# Patient Record
Sex: Male | Born: 1965 | Hispanic: Yes | Smoking: Never smoker
Health system: Southern US, Community
[De-identification: ages and names within clinical notes are randomized; demographics above are authoritative.]

## PROBLEM LIST (undated history)

## (undated) DIAGNOSIS — F419 Anxiety disorder, unspecified: Secondary | ICD-10-CM

## (undated) DIAGNOSIS — K759 Inflammatory liver disease, unspecified: Secondary | ICD-10-CM

## (undated) DIAGNOSIS — Z5189 Encounter for other specified aftercare: Secondary | ICD-10-CM

## (undated) DIAGNOSIS — D649 Anemia, unspecified: Secondary | ICD-10-CM

## (undated) HISTORY — DX: Encounter for other specified aftercare: Z51.89

## (undated) HISTORY — DX: Anemia, unspecified: D64.9

---

## 1999-03-05 ENCOUNTER — Emergency Department (HOSPITAL_COMMUNITY): Admission: EM | Admit: 1999-03-05 | Discharge: 1999-03-05 | Payer: Self-pay | Admitting: Emergency Medicine

## 1999-03-05 ENCOUNTER — Encounter: Payer: Self-pay | Admitting: Emergency Medicine

## 1999-03-08 ENCOUNTER — Emergency Department (HOSPITAL_COMMUNITY): Admission: EM | Admit: 1999-03-08 | Discharge: 1999-03-08 | Payer: Self-pay | Admitting: Emergency Medicine

## 2009-03-18 ENCOUNTER — Emergency Department (HOSPITAL_COMMUNITY): Admission: EM | Admit: 2009-03-18 | Discharge: 2009-03-18 | Payer: Self-pay | Admitting: Emergency Medicine

## 2010-04-09 ENCOUNTER — Encounter (INDEPENDENT_AMBULATORY_CARE_PROVIDER_SITE_OTHER): Payer: Self-pay | Admitting: *Deleted

## 2010-04-09 ENCOUNTER — Ambulatory Visit: Payer: Self-pay | Admitting: Internal Medicine

## 2010-04-09 LAB — CONVERTED CEMR LAB
ALT: 29 units/L (ref 0–53)
AST: 24 units/L (ref 0–37)
Albumin: 4.6 g/dL (ref 3.5–5.2)
Alkaline Phosphatase: 110 units/L (ref 39–117)
BUN: 14 mg/dL (ref 6–23)
Basophils Absolute: 0 10*3/uL (ref 0.0–0.1)
Basophils Relative: 1 % (ref 0–1)
CO2: 26 meq/L (ref 19–32)
Calcium: 9.4 mg/dL (ref 8.4–10.5)
Chloride: 105 meq/L (ref 96–112)
Cholesterol: 207 mg/dL — ABNORMAL HIGH (ref 0–200)
Creatinine, Ser: 0.85 mg/dL (ref 0.40–1.50)
Eosinophils Absolute: 0.1 10*3/uL (ref 0.0–0.7)
Eosinophils Relative: 2 % (ref 0–5)
Glucose, Bld: 104 mg/dL — ABNORMAL HIGH (ref 70–99)
HCT: 49.5 % (ref 39.0–52.0)
HDL: 49 mg/dL (ref >39)
Hemoglobin: 16.4 g/dL (ref 13.0–17.0)
LDL Cholesterol: 120 mg/dL — ABNORMAL HIGH (ref 0–99)
Lymphocytes Relative: 31 % (ref 12–46)
Lymphs Abs: 2.1 10*3/uL (ref 0.7–4.0)
MCHC: 33.1 g/dL (ref 30.0–36.0)
MCV: 91.3 fL (ref 78.0–100.0)
Monocytes Absolute: 0.7 10*3/uL (ref 0.1–1.0)
Monocytes Relative: 10 % (ref 3–12)
Neutro Abs: 3.9 10*3/uL (ref 1.7–7.7)
Neutrophils Relative %: 57 % (ref 43–77)
Platelets: 226 10*3/uL (ref 150–400)
Potassium: 4.3 meq/L (ref 3.5–5.3)
RBC: 5.42 M/uL (ref 4.22–5.81)
RDW: 13.2 % (ref 11.5–15.5)
Sodium: 141 meq/L (ref 135–145)
TSH: 2.225 microintl units/mL (ref 0.350–4.500)
Total Bilirubin: 0.7 mg/dL (ref 0.3–1.2)
Total CHOL/HDL Ratio: 4.2
Total Protein: 7.3 g/dL (ref 6.0–8.3)
Triglycerides: 188 mg/dL — ABNORMAL HIGH (ref <150)
VLDL: 38 mg/dL (ref 0–40)
WBC: 6.8 10*3/uL (ref 4.0–10.5)

## 2011-07-02 ENCOUNTER — Ambulatory Visit: Payer: Self-pay

## 2011-07-02 DIAGNOSIS — R002 Palpitations: Secondary | ICD-10-CM

## 2013-02-24 ENCOUNTER — Encounter (HOSPITAL_COMMUNITY): Payer: Self-pay

## 2013-02-24 ENCOUNTER — Emergency Department (HOSPITAL_COMMUNITY)
Admission: EM | Admit: 2013-02-24 | Discharge: 2013-02-24 | Disposition: A | Discharge status: Home / Self Care | Payer: Self-pay | Attending: Emergency Medicine | Admitting: Emergency Medicine

## 2013-02-24 DIAGNOSIS — F1010 Alcohol abuse, uncomplicated: Secondary | ICD-10-CM | POA: Insufficient documentation

## 2013-02-24 DIAGNOSIS — F419 Anxiety disorder, unspecified: Secondary | ICD-10-CM | POA: Diagnosis present

## 2013-02-24 DIAGNOSIS — F411 Generalized anxiety disorder: Secondary | ICD-10-CM | POA: Insufficient documentation

## 2013-02-24 DIAGNOSIS — R000 Tachycardia, unspecified: Secondary | ICD-10-CM | POA: Insufficient documentation

## 2013-02-24 DIAGNOSIS — Z79899 Other long term (current) drug therapy: Secondary | ICD-10-CM | POA: Insufficient documentation

## 2013-02-24 HISTORY — DX: Anxiety disorder, unspecified: F41.9

## 2013-02-24 MED ORDER — LORAZEPAM 1 MG PO TABS
1.0000 mg | ORAL_TABLET | Freq: Once | ORAL | Status: AC
  Administered 2013-02-24: 1 mg via ORAL
  Filled 2013-02-24: qty 1

## 2013-02-24 MED ORDER — HYDROXYZINE HCL 25 MG PO TABS: 25.0000 mg | ORAL_TABLET | Freq: Three times a day (TID) | ORAL | Status: DC | PRN

## 2013-02-24 MED ORDER — HYDROXYZINE HCL 25 MG PO TABS: ORAL_TABLET | ORAL | Status: DC

## 2013-02-24 NOTE — ED Provider Notes (Signed)
CSN: [id number]     Arrival date & time 02/24/13  1742 History     First MD Initiated Contact with Patient 02/24/13 1841     Chief Complaint  Patient presents with  . Tachycardia  . Panic Attack   (Consider location/radiation/quality/duration/timing/severity/associated sxs/prior Treatment) Patient is a 47 y.o. male presenting with mental health disorder. The history is provided by the patient.  Mental Health Problem Presenting symptoms comment:  Anxiety  Degree of incapacity (severity):  Mild Onset quality:  Sudden Duration:  3 days Timing:  Intermittent Progression:  Unchanged Chronicity:  Recurrent Context comment:  Concerned about recent sexual encounter, worried about work Treatment compliance:  Untreated Time since last psychoactive medication taken:  2 months Relieved by: deep breathing, drinking beer. Worsened by:  Nothing tried Ineffective treatments: citalopram. Associated symptoms: no abdominal pain, no chest pain and no headaches     Past Medical History  Diagnosis Date  . Anxiety    History reviewed. No pertinent past surgical history. Family History  Problem Relation Age of Onset  . Hypertension Father   . Diabetes Father    History  Substance Use Topics  . Smoking status: Never Smoker   . Smokeless tobacco: Never Used  . Alcohol Use: Yes     Comment: 4-5 beers daily    Review of Systems  Constitutional: Negative for fever.  HENT: Negative for rhinorrhea, drooling and neck pain.   Eyes: Negative for pain.  Respiratory: Negative for cough and shortness of breath.   Cardiovascular: Negative for chest pain and leg swelling.  Gastrointestinal: Negative for nausea, vomiting, abdominal pain and diarrhea.  Genitourinary: Negative for dysuria and hematuria.  Musculoskeletal: Negative for gait problem.  Skin: Negative for color change.  Neurological: Negative for numbness and headaches.  Hematological: Negative for adenopathy.  Psychiatric/Behavioral:  Negative for behavioral problems.  All other systems reviewed and are negative.    Allergies  Review of patient's allergies indicates no known allergies.  Home Medications   Current Outpatient Rx  Name  Route  Sig  Dispense  Refill  . ranitidine (ZANTAC) 150 MG tablet   Oral   Take 150 mg by mouth 2 (two) times daily.          BP 134/88  Pulse 124  Temp(Src) 98 F (36.7 C) (Oral)  Resp 20  Ht 5\' 5"  (1.651 m)  Wt 180 lb (81.647 kg)  BMI 29.95 kg/m2  SpO2 99% Physical Exam  Nursing note and vitals reviewed. Constitutional: He is oriented to person, place, and time. He appears well-developed and well-nourished.  HENT:  Head: Normocephalic and atraumatic.  Right Ear: External ear normal.  Left Ear: External ear normal.  Nose: Nose normal.  Mouth/Throat: Oropharynx is clear and moist. No oropharyngeal exudate.  Eyes: Conjunctivae and EOM are normal. Pupils are equal, round, and reactive to light.  Neck: Normal range of motion. Neck supple.  Cardiovascular: Regular rhythm, normal heart sounds and intact distal pulses.  Exam reveals no gallop and no friction rub.   No murmur heard. Pulmonary/Chest: Effort normal and breath sounds normal. No respiratory distress. He has no wheezes.  Abdominal: Soft. Bowel sounds are normal. He exhibits no distension. There is no tenderness. There is no rebound and no guarding.  Musculoskeletal: Normal range of motion. He exhibits no edema and no tenderness.  Neurological: He is alert and oriented to person, place, and time.  Skin: Skin is warm and dry.  Psychiatric: He has a normal mood and affect. His  behavior is normal.    ED Course   Procedures (including critical care time)  Labs Reviewed  GC/CHLAMYDIA PROBE AMP   No results found. 1. Anxiety      Date: 02/24/2013  Rate: 127  Rhythm: sinus tachycardia  QRS Axis: normal  Intervals: normal  ST/T Wave abnormalities: normal  Conduction Disutrbances:none  Narrative  Interpretation: No ST or T wave changes consistent with ischemia.   Old EKG Reviewed: none available   MDM  7:17 PM 47 y.o. male w hx of anxiety/panic attacks who pw increasing freq of panic attacks in last few days. Pt tachycardic here, notes inc freq of episodes. Denies cp or sob. Appears well on exam. Will give ativan. He is also concerned about a recent sexual encounter, but denies GU sx. Will perform gc/chl amp at his request.   8:52 PM: Pt feeling better after ativan, HR has decreased.  I have discussed the diagnosis/risks/treatment options with the patient and believe the pt to be eligible for discharge home to follow-up and establish with a pcp for his anxiety attacks. We also discussed returning to the ED immediately if new or worsening sx occur. We discussed the sx which are most concerning (e.g., worsening anxiety, cp, sob) that necessitate immediate return. Any new prescriptions provided to the patient are listed below.  New Prescriptions   No medications on file      Junius Argyle, MD 02/25/13 [phone]

## 2013-02-24 NOTE — ED Notes (Addendum)
Patient reports that he has a history of anxiety and has had anxiety attacks. Patient states he had SOB and fast heart rate at 0330 today. Patient states he has had increased stress. Patient states that he has not taken citalopram in 2 months.

## 2013-02-24 NOTE — ED Notes (Signed)
Pt denies chest pain or nausea. Pt c/o intermittent shortness of breath. Pt states "it feels like my heart is beating real hard.". Pt admits to hx of anxiety. Pt a/o x 4. No acute distress. Pt talkative.

## 2013-02-25 LAB — GC/CHLAMYDIA PROBE AMP
CT Probe RNA: NEGATIVE
GC Probe RNA: NEGATIVE

## 2017-03-01 ENCOUNTER — Encounter (HOSPITAL_COMMUNITY): Payer: Self-pay | Admitting: Emergency Medicine

## 2017-03-01 ENCOUNTER — Emergency Department (HOSPITAL_COMMUNITY)
Admission: EM | Admit: 2017-03-01 | Discharge: 2017-03-01 | Disposition: A | Discharge status: Home / Self Care | Payer: Self-pay | Attending: Emergency Medicine | Admitting: Emergency Medicine

## 2017-03-01 ENCOUNTER — Emergency Department (HOSPITAL_COMMUNITY): Payer: Self-pay

## 2017-03-01 DIAGNOSIS — M791 Myalgia: Secondary | ICD-10-CM | POA: Insufficient documentation

## 2017-03-01 DIAGNOSIS — Z79899 Other long term (current) drug therapy: Secondary | ICD-10-CM | POA: Insufficient documentation

## 2017-03-01 DIAGNOSIS — R69 Illness, unspecified: Secondary | ICD-10-CM | POA: Insufficient documentation

## 2017-03-01 DIAGNOSIS — J111 Influenza due to unidentified influenza virus with other respiratory manifestations: Secondary | ICD-10-CM

## 2017-03-01 DIAGNOSIS — R51 Headache: Secondary | ICD-10-CM | POA: Insufficient documentation

## 2017-03-01 DIAGNOSIS — R509 Fever, unspecified: Secondary | ICD-10-CM | POA: Insufficient documentation

## 2017-03-01 HISTORY — DX: Inflammatory liver disease, unspecified: K75.9

## 2017-03-01 LAB — COMPREHENSIVE METABOLIC PANEL
ALT: 30 U/L (ref 17–63)
AST: 22 U/L (ref 15–41)
Albumin: 3.7 g/dL (ref 3.5–5.0)
Alkaline Phosphatase: 79 U/L (ref 38–126)
Anion gap: 11 (ref 5–15)
BILIRUBIN TOTAL: 0.9 mg/dL (ref 0.3–1.2)
BUN: 10 mg/dL (ref 6–20)
CHLORIDE: 99 mmol/L — AB (ref 101–111)
CO2: 24 mmol/L (ref 22–32)
CREATININE: 1.05 mg/dL (ref 0.61–1.24)
Calcium: 9 mg/dL (ref 8.9–10.3)
GFR calc Af Amer: >60 mL/min (ref >60)
Glucose, Bld: 102 mg/dL — ABNORMAL HIGH (ref 65–99)
POTASSIUM: 3.9 mmol/L (ref 3.5–5.1)
SODIUM: 134 mmol/L — AB (ref 135–145)
TOTAL PROTEIN: 8.1 g/dL (ref 6.5–8.1)

## 2017-03-01 LAB — URINALYSIS, ROUTINE W REFLEX MICROSCOPIC
Bacteria, UA: NONE SEEN
Bilirubin Urine: NEGATIVE
Glucose, UA: NEGATIVE mg/dL
Hgb urine dipstick: NEGATIVE
KETONES UR: 5 mg/dL — AB
Leukocytes, UA: NEGATIVE
Nitrite: NEGATIVE
PH: 5 (ref 5.0–8.0)
Protein, ur: 30 mg/dL — AB
SPECIFIC GRAVITY, URINE: 1.031 — AB (ref 1.005–1.030)
SQUAMOUS EPITHELIAL / LPF: NONE SEEN

## 2017-03-01 LAB — I-STAT CG4 LACTIC ACID, ED: LACTIC ACID, VENOUS: 1.29 mmol/L (ref 0.5–1.9)

## 2017-03-01 LAB — CBC WITH DIFFERENTIAL/PLATELET
BASOS ABS: 0 10*3/uL (ref 0.0–0.1)
Basophils Relative: 0 %
EOS ABS: 0 10*3/uL (ref 0.0–0.7)
EOS PCT: 0 %
HCT: 41.3 % (ref 39.0–52.0)
Hemoglobin: 13.4 g/dL (ref 13.0–17.0)
Lymphocytes Relative: 7 %
Lymphs Abs: 0.6 10*3/uL — ABNORMAL LOW (ref 0.7–4.0)
MCH: 25.9 pg — ABNORMAL LOW (ref 26.0–34.0)
MCHC: 32.4 g/dL (ref 30.0–36.0)
MCV: 79.9 fL (ref 78.0–100.0)
Monocytes Absolute: 1.2 10*3/uL — ABNORMAL HIGH (ref 0.1–1.0)
Monocytes Relative: 14 %
Neutro Abs: 6.7 10*3/uL (ref 1.7–7.7)
Neutrophils Relative %: 79 %
PLATELETS: 245 10*3/uL (ref 150–400)
RBC: 5.17 MIL/uL (ref 4.22–5.81)
RDW: 13.5 % (ref 11.5–15.5)
WBC: 8.6 10*3/uL (ref 4.0–10.5)

## 2017-03-01 MED ORDER — ACETAMINOPHEN 325 MG PO TABS
ORAL_TABLET | ORAL | Status: DC
Start: 2017-03-01 — End: 2017-03-02
  Filled 2017-03-01: qty 2

## 2017-03-01 MED ORDER — ACETAMINOPHEN 325 MG PO TABS
325.0000 mg | ORAL_TABLET | Freq: Once | ORAL | Status: AC
  Administered 2017-03-01: 650 mg via ORAL

## 2017-03-01 MED ORDER — SODIUM CHLORIDE 0.9 % IV BOLUS (SEPSIS)
1000.0000 mL | Freq: Once | INTRAVENOUS | Status: AC
  Administered 2017-03-01: 1000 mL via INTRAVENOUS

## 2017-03-01 MED ORDER — LORAZEPAM 1 MG PO TABS
1.0000 mg | ORAL_TABLET | Freq: Once | ORAL | Status: AC
  Administered 2017-03-01: 1 mg via ORAL
  Filled 2017-03-01: qty 1

## 2017-03-01 MED ORDER — IBUPROFEN 800 MG PO TABS
800.0000 mg | ORAL_TABLET | Freq: Once | ORAL | Status: AC
  Administered 2017-03-01: 800 mg via ORAL
  Filled 2017-03-01: qty 1

## 2017-03-01 NOTE — ED Notes (Addendum)
Pt given 650 mg tylenol for fever 102.2

## 2017-03-01 NOTE — ED Provider Notes (Addendum)
[facility] DEPT Provider Note   CSN: [id number] Arrival date & time: 03/01/17  1358     History   Chief Complaint Chief Complaint  Patient presents with  . Cough  . Fever  . Generalized Body Aches    HPI Troy Stevenson is a 51 y.o. male.  HPI  51 year old man history of hepatitis presents today stating that he has had body aches and chills that began on Thursday. Thursday night he resolved his symptoms with Tylenol. He states they resumed on Friday. That again resolved with Tylenol. He describes his symptoms as being diffuse muscle aches, chills, sweating, and now with headache. He was seen at his primary care office today. He states that when he opens his mouth there is a comfort in the anterior neck. He denies nausea, vomiting, or diarrhea. Here he presented to triage with a fever to 102. He is given Tylenol and states this time it is not made him feel much better. He works as a Development worker, community. He has had no known sick contacts. He states he has not been out of the country [place]. Patient was seen by his primary care provider today. They referred him to the emergency department due to his symptoms. They were concerned regarding possible meningitis.  Past Medical History:  Diagnosis Date  . Anxiety   . Hepatitis     Patient Active Problem List   Diagnosis Date Noted  . Anxiety 02/24/2013    History reviewed. No pertinent surgical history.     Home Medications    Prior to Admission medications   Medication Sig Start Date End Date Taking? Authorizing Provider  hydrOXYzine (ATARAX/VISTARIL) 25 MG tablet Take 1 by mouth every 8 hours as needed for anxiety. 02/24/13   Pamella Pert, MD  ranitidine (ZANTAC) 150 MG tablet Take 150 mg by mouth 2 (two) times daily.    [provider]    Family History Family History  Problem Relation Age of Onset  . Hypertension Father   . Diabetes Father     Social History Social History   Substance Use Topics  . Smoking status: Never Smoker  . Smokeless tobacco: Never Used  . Alcohol use Yes     Comment: 4-5 beers daily     Allergies   Patient has no known allergies.   Review of Systems Review of Systems   Physical Exam Updated Vital Signs BP 125/84 (BP Location: Left Arm)   Pulse (!) 118   Temp (!) 102.2 F (39 C) (Oral)   Resp (!) 22   SpO2 97%   Physical Exam  Constitutional: He is oriented to person, place, and time. He appears well-developed and well-nourished. No distress.  HENT:  Head: Normocephalic and atraumatic.  Right Ear: External ear normal.  Left Ear: External ear normal.  Mouth/Throat: Oropharynx is clear and moist.  Eyes: Pupils are equal, round, and reactive to light. Conjunctivae and EOM are normal.  Neck: Normal range of motion. Neck supple.  Patient freely ranges his neck through a full active range of motion.  Cardiovascular: Tachycardia present.   Pulmonary/Chest: Effort normal and breath sounds normal.  Crackles right base  Abdominal: Soft. Bowel sounds are normal.  Musculoskeletal: Normal range of motion.  Neurological: He is alert and oriented to person, place, and time. He displays normal reflexes. No cranial nerve deficit or sensory deficit. He exhibits normal muscle tone. Coordination normal.  Skin: Skin is warm and dry. Capillary refill takes  less than 2 seconds.  Psychiatric: He has a normal mood and affect.  Nursing note and vitals reviewed.    ED Treatments / Results  Labs (all labs ordered are listed, but only abnormal results are displayed) Labs Reviewed  CULTURE, BLOOD (ROUTINE X 2)  CULTURE, BLOOD (ROUTINE X 2)  CBC WITH DIFFERENTIAL/PLATELET  COMPREHENSIVE METABOLIC PANEL  URINALYSIS, ROUTINE W REFLEX MICROSCOPIC  I-STAT CG4 LACTIC ACID, ED    EKG  EKG Interpretation None        Radiology Dg Chest 2 View  Result Date: 03/01/2017 CLINICAL DATA:  Patient reports since thur he has felt weak,  dizziness, body aches, headache, fever. Reports cough that started today. EXAM: CHEST  2 VIEW COMPARISON:  None. FINDINGS: Normal mediastinum and cardiac silhouette. Normal pulmonary vasculature. No evidence of effusion, infiltrate, or pneumothorax. No acute bony abnormality. IMPRESSION: No acute cardiopulmonary process. Electronically Signed   By: Suzy Bouchard M.D.   On: 03/01/2017 16:40    Procedures Procedures (including critical care time)  Medications Ordered in ED Medications  acetaminophen (TYLENOL) 325 MG tablet (not administered)  sodium chloride 0.9 % bolus 1,000 mL (not administered)  acetaminophen (TYLENOL) tablet 325 mg (650 mg Oral Given 03/01/17 1426)     Initial Impression / Assessment and Plan / ED Course  I have reviewed the triage vital signs and the nursing notes.  Pertinent labs & imaging results that were available during my care of the patient were reviewed by me and considered in my medical decision making (see chart for details).     Evaluation for infection included blood cultures 2, urinalysis, chest x-Laneta Guerin, and labs. He has received IV normal saline. His heart rate has decreased. He has a normal lactic acid level here. He otherwise appears well. Reports no history of tick bites or rashes.I reviewed the primary care physician's assessment and plan. Given his initial evaluation there, his presentation was concerning for meningitis. However, here he has no meningeal symptoms and has been observed for several hours with normal labs and vital signs. He was tachycardic initially, but this has resolved with his fever. I discussed the meningitis concerns with patient. We discussed LP. I discussed why did not think he needs an LP at this time and he is in agreement. We also discussed that if he is worse especially with headache worsening he should return to the ED. Given his well appearance and response to antipyretics and fluids, I do not see any indication for antibiotics  at this time. However, I have discussed return precautions need for close follow-up with the patient and he voices understanding.  Final Clinical Impressions(s) / ED Diagnoses   Final diagnoses:  Influenza-like illness    New Prescriptions New Prescriptions   No medications on file     Pattricia Boss, MD 03/01/17 1924    Pattricia Boss, MD 03/01/17 2028

## 2017-03-01 NOTE — ED Triage Notes (Signed)
Headache body aches , fever and cough x 4 days

## 2017-03-01 NOTE — ED Notes (Signed)
Pt to finish IV boluses prior to discharge

## 2017-03-06 LAB — CULTURE, BLOOD (ROUTINE X 2)
Culture: NO GROWTH
Culture: NO GROWTH
SPECIAL REQUESTS: ADEQUATE
Special Requests: ADEQUATE

## 2018-08-05 ENCOUNTER — Encounter: Payer: Self-pay | Admitting: Gastroenterology

## 2018-08-20 ENCOUNTER — Encounter (HOSPITAL_COMMUNITY): Payer: Self-pay

## 2018-08-20 ENCOUNTER — Other Ambulatory Visit: Payer: Self-pay

## 2018-08-20 ENCOUNTER — Emergency Department (HOSPITAL_COMMUNITY)
Admission: EM | Admit: 2018-08-20 | Discharge: 2018-08-20 | Disposition: A | Discharge status: Home / Self Care | Payer: Self-pay | Attending: Emergency Medicine | Admitting: Emergency Medicine

## 2018-08-20 DIAGNOSIS — Z79899 Other long term (current) drug therapy: Secondary | ICD-10-CM | POA: Insufficient documentation

## 2018-08-20 DIAGNOSIS — D649 Anemia, unspecified: Secondary | ICD-10-CM | POA: Insufficient documentation

## 2018-08-20 DIAGNOSIS — K922 Gastrointestinal hemorrhage, unspecified: Secondary | ICD-10-CM | POA: Insufficient documentation

## 2018-08-20 LAB — CBC
HCT: 32.6 % — ABNORMAL LOW (ref 39.0–52.0)
HEMOGLOBIN: 9.7 g/dL — AB (ref 13.0–17.0)
MCH: 22.5 pg — ABNORMAL LOW (ref 26.0–34.0)
MCHC: 29.8 g/dL — AB (ref 30.0–36.0)
MCV: 75.5 fL — ABNORMAL LOW (ref 80.0–100.0)
PLATELETS: 337 10*3/uL (ref 150–400)
RBC: 4.32 MIL/uL (ref 4.22–5.81)
RDW: 13.6 % (ref 11.5–15.5)
WBC: 4.9 10*3/uL (ref 4.0–10.5)
nRBC: 0 % (ref 0.0–0.2)

## 2018-08-20 LAB — COMPREHENSIVE METABOLIC PANEL
ALT: 22 U/L (ref 0–44)
AST: 23 U/L (ref 15–41)
Albumin: 4 g/dL (ref 3.5–5.0)
Alkaline Phosphatase: 94 U/L (ref 38–126)
Anion gap: 8 (ref 5–15)
BILIRUBIN TOTAL: 0.5 mg/dL (ref 0.3–1.2)
BUN: 8 mg/dL (ref 6–20)
CO2: 23 mmol/L (ref 22–32)
CREATININE: 0.81 mg/dL (ref 0.61–1.24)
Calcium: 9.2 mg/dL (ref 8.9–10.3)
Chloride: 107 mmol/L (ref 98–111)
GFR calc Af Amer: >60 mL/min (ref >60)
Glucose, Bld: 103 mg/dL — ABNORMAL HIGH (ref 70–99)
POTASSIUM: 3.8 mmol/L (ref 3.5–5.1)
Sodium: 138 mmol/L (ref 135–145)
TOTAL PROTEIN: 7.4 g/dL (ref 6.5–8.1)

## 2018-08-20 LAB — ABO/RH: ABO/RH(D): O POS

## 2018-08-20 LAB — LIPASE, BLOOD: Lipase: 37 U/L (ref 11–51)

## 2018-08-20 LAB — TYPE AND SCREEN
ABO/RH(D): O POS
ANTIBODY SCREEN: NEGATIVE

## 2018-08-20 NOTE — ED Provider Notes (Signed)
[facility] EMERGENCY DEPARTMENT Provider Note   CSN: [id number] Arrival date & time: 08/20/18  1455     History   Chief Complaint Chief Complaint  Patient presents with  . Rectal Bleeding  . Abdominal Pain  . Near Syncope    HPI Troy Stevenson is a 53 y.o. male.  He has had on-and-off rectal bleeding for about 5 years.  Sounds like since the beginning of January it is been more consistent every bowel movement twice a day.  There is red blood in the bowl.  Sometimes her some clots.  Not associated with any abdominal pain.  He has been to the clinic and his last hemoglobin on the 30th was 11.  He is set up to get a colonoscopy in 4 days.  Today he felt lightheaded and dizzy, which is what the clinic told him to watch out for so he presented to the emergency department.  He said he feels thirsty but he is feeling better here.  No fever no abdominal pain no shortness of breath no syncope.  The history is provided by the patient.  Rectal Bleeding  Quality:  Bright red Amount:  Moderate Duration:  2 months Timing:  Intermittent Chronicity:  New Context: defecation   Context: not foreign body and not rectal pain   Similar prior episodes: yes   Relieved by:  None tried Worsened by:  Defecation Ineffective treatments:  Time Associated symptoms: dizziness and light-headedness   Associated symptoms: no abdominal pain, no epistaxis, no fever, no loss of consciousness, no recent illness and no vomiting   Risk factors: no anticoagulant use and no hx of colorectal cancer     Past Medical History:  Diagnosis Date  . Anxiety   . Hepatitis     Patient Active Problem List   Diagnosis Date Noted  . Anxiety 02/24/2013    History reviewed. No pertinent surgical history.      Home Medications    Prior to Admission medications   Medication Sig Start Date End Date Taking? Authorizing Provider  hydrOXYzine (ATARAX/VISTARIL) 25 MG tablet Take 1 by mouth every 8  hours as needed for anxiety. 02/24/13   Pamella Pert, MD  ranitidine (ZANTAC) 150 MG tablet Take 150 mg by mouth 2 (two) times daily.    [provider]    Family History Family History  Problem Relation Age of Onset  . Hypertension Father   . Diabetes Father     Social History Social History   Tobacco Use  . Smoking status: Never Smoker  . Smokeless tobacco: Never Used  Substance Use Topics  . Alcohol use: Yes    Comment: 4-5 beers daily  . Drug use: No     Allergies   Patient has no known allergies.   Review of Systems Review of Systems  Constitutional: Positive for fatigue. Negative for chills and fever.  HENT: Negative for ear pain, nosebleeds and sore throat.   Eyes: Negative for pain and visual disturbance.  Respiratory: Negative for cough and shortness of breath.   Cardiovascular: Negative for chest pain and palpitations.  Gastrointestinal: Positive for anal bleeding and blood in stool. Negative for abdominal pain, constipation and vomiting.  Genitourinary: Negative for dysuria and hematuria.  Musculoskeletal: Negative for arthralgias and back pain.  Skin: Negative for color change and rash.  Neurological: Positive for dizziness and light-headedness. Negative for seizures, loss of consciousness and syncope.  All other systems reviewed and are negative.    Physical  Exam Updated Vital Signs BP (!) 144/74 (BP Location: Right Arm)   Pulse 91   Temp (!) 97.5 F (36.4 C) (Oral)   Resp 18   SpO2 99%   Physical Exam Vitals signs and nursing note reviewed.  Constitutional:      Appearance: He is well-developed.  HENT:     Head: Normocephalic and atraumatic.  Eyes:     Conjunctiva/sclera: Conjunctivae normal.  Neck:     Musculoskeletal: Neck supple.  Cardiovascular:     Rate and Rhythm: Normal rate and regular rhythm.     Heart sounds: No murmur.  Pulmonary:     Effort: Pulmonary effort is normal. No respiratory distress.     Breath  sounds: Normal breath sounds.  Abdominal:     Palpations: Abdomen is soft.     Tenderness: There is no abdominal tenderness.  Musculoskeletal:        General: No deformity.     Right lower leg: No edema.     Left lower leg: No edema.  Skin:    General: Skin is warm and dry.     Capillary Refill: Capillary refill takes less than 2 seconds.  Neurological:     General: No focal deficit present.     Mental Status: He is alert and oriented to person, place, and time.     Motor: No weakness.      ED Treatments / Results  Labs (all labs ordered are listed, but only abnormal results are displayed) Labs Reviewed  COMPREHENSIVE METABOLIC PANEL - Abnormal; Notable for the following components:      Result Value   Glucose, Bld 103 (*)    All other components within normal limits  CBC - Abnormal; Notable for the following components:   Hemoglobin 9.7 (*)    HCT 32.6 (*)    MCV 75.5 (*)    MCH 22.5 (*)    MCHC 29.8 (*)    All other components within normal limits  LIPASE, BLOOD  TYPE AND SCREEN  ABO/RH    EKG None  Radiology No results found.  Procedures Procedures (including critical care time)  Medications Ordered in ED Medications - No data to display   Initial Impression / Assessment and Plan / ED Course  I have reviewed the triage vital signs and the nursing notes.  Pertinent labs & imaging results that were available during my care of the patient were reviewed by me and considered in my medical decision making (see chart for details).  Clinical Course as of Aug 21 2131  Sat Aug 20, 2018  1636 Reviewed the clinic notes 2 visits over the last month.  He has had blood work done a rectal exam and had a CAT scan of the abdomen and pelvis which the patient relates to me was normal.  He set up for an outpatient colonoscopy in a few days.  Here with some symptomatic lightheadedness and found to be more anemic than his baseline.  He otherwise is very nontoxic-appearing.  The  rest of his blood work is unremarkable.  We will have him check orthostatics.   [MB]  4097 Patient was not symptomatic with orthostatics.  He has an outpatient colonoscopy already scheduled in a few days.  He understands to drink plenty of fluids and eat a balanced diet and take vitamins.  He will return if any worsening symptoms.   [MB]    Clinical Course User Index [MB] Hayden Rasmussen, MD     Final Clinical  Impressions(s) / ED Diagnoses   Final diagnoses:  Lower GI bleed  Anemia, unspecified type    ED Discharge Orders    None       Hayden Rasmussen, MD 08/20/18 2133

## 2018-08-20 NOTE — Discharge Instructions (Addendum)
You were seen in the emergency department for continued lower GI bleeding along with feeling fatigue.  Your blood counts continue to drop although you are not in the critical range.  Please keep your appointment for your colonoscopy this week.  Drink plenty of fluids and eat a balanced diet.  If you experience any worsening lightheadedness or any shortness of breath please return to the emergency department.

## 2018-08-20 NOTE — ED Notes (Signed)
Declined W/C at D/C and was escorted to lobby by RN. 

## 2018-08-20 NOTE — ED Triage Notes (Signed)
Pt reports rectal bleeding for the past few months and intermittent abd pain, had a colonoscopy scheduled but pt cannot afford it right now. Pt came today due to feeling weak and having a near syncopal episode. Pt a.o at this time, denies any pain.

## 2018-08-24 ENCOUNTER — Ambulatory Visit: Payer: Self-pay | Admitting: Gastroenterology

## 2018-08-24 ENCOUNTER — Encounter: Payer: Self-pay | Admitting: Gastroenterology

## 2018-08-24 VITALS — BP 122/68 | HR 84 | Ht 65.0 in | Wt 178.0 lb

## 2018-08-24 DIAGNOSIS — D649 Anemia, unspecified: Secondary | ICD-10-CM

## 2018-08-24 DIAGNOSIS — K625 Hemorrhage of anus and rectum: Secondary | ICD-10-CM

## 2018-08-24 MED ORDER — PEG 3350-KCL-NA BICARB-NACL 420 G PO SOLR: 4000.0000 mL | ORAL | 0 refills | Status: DC

## 2018-08-24 NOTE — Patient Instructions (Addendum)
Request CT scan report from [facility] 2-3 weeks ago.  Start iron once daily (OTC, ferrous sulfate 325mg ).  You will be set up for a colonoscopy for rectal bleeding, anemia.  Thank you for entrusting me with your care and choosing [facility].  Dr Ardis Hughs

## 2018-08-24 NOTE — Progress Notes (Signed)
HPI: This is a very pleasant 53 year old man who was referred to me by his PCP I believe at [facility] for rectal bleeding, anemia  Chief complaint is rectal bleeding, anemia  Has intermittent rectal bleeding dating back even 5 years ago.  Minor, about 1-2 times per year. However in the past 3 months it's increased.  Drinks beer 4-5 daily and he thinks when he stops the alcohol that this minor intermittent bleeding has stopped in the past however for the past month or so it has not stopped.  It is daily.  He is not having diarrhea.  He has solid stool that is streaked in blood daily.  CT scan in [facility] 3 weeks ago and he was told it looked normal.  I do not have that report at the time of this visit.  Mild LLQ pain, chronically for 15 years.    No FH of colon cancer.  Has lost 7 pounds in the past month or so.    Old Data Reviewed: Blood work February 2020 showed normal complete metabolic profile, significant anemia with a hemoglobin of 9.7, MCV 75.5, normal platelets.  CBC a year or 2 ago showed a normal hemoglobin.    Review of systems: Pertinent positive and negative review of systems were noted in the above HPI section. All other review negative.   Past Medical History:  Diagnosis Date  . Anxiety   . Hepatitis     History reviewed. No pertinent surgical history.  Current Outpatient Medications  Medication Sig Dispense Refill  . omeprazole (PRILOSEC) 40 MG capsule Take 1 capsule by mouth daily.     No current facility-administered medications for this visit.     Allergies as of 08/24/2018  . (No Known Allergies)    Family History  Problem Relation Age of Onset  . Hypertension Father   . Diabetes Father     Social History   Socioeconomic History  . Marital status: Divorced    Spouse name: Not on file  . Number of children: 1  . Years of education: Not on file  . Highest education level: Not on file  Occupational History  . Not on file  Social  Needs  . Financial resource strain: Not on file  . Food insecurity:    Worry: Not on file    Inability: Not on file  . Transportation needs:    Medical: Not on file    Non-medical: Not on file  Tobacco Use  . Smoking status: Never Smoker  . Smokeless tobacco: Never Used  Substance and Sexual Activity  . Alcohol use: Yes    Comment: 4-5 beers daily  . Drug use: No  . Sexual activity: Not on file  Lifestyle  . Physical activity:    Days per week: Not on file    Minutes per session: Not on file  . Stress: Not on file  Relationships  . Social connections:    Talks on phone: Not on file    Gets together: Not on file    Attends religious service: Not on file    Active member of club or organization: Not on file    Attends meetings of clubs or organizations: Not on file    Relationship status: Not on file  . Intimate partner violence:    Fear of current or ex partner: Not on file    Emotionally abused: Not on file    Physically abused: Not on file    Forced sexual activity: Not on  file  Other Topics Concern  . Not on file  Social History Narrative  . Not on file     Physical Exam: BP 122/68   Pulse 84   Ht 5\' 5"  (1.651 m)   Wt 178 lb (80.7 kg)   BMI 29.62 kg/m  Constitutional: generally well-appearing Psychiatric: alert and oriented x3 Eyes: extraocular movements intact Mouth: oral pharynx moist, no lesions Neck: supple no lymphadenopathy Cardiovascular: heart regular rate and rhythm Lungs: clear to auscultation bilaterally Abdomen: soft, nontender, nondistended, no obvious ascites, no peritoneal signs, normal bowel sounds Extremities: no lower extremity edema bilaterally Skin: no lesions on visible extremities Rectal exam deferred for upcoming colonoscopy, 2 days from now  Assessment and plan: 53 y.o. male with microcytic anemia, daily rectal bleeding  I recommended colonoscopy at his soonest convenience.  I do have some concern for underlying malignancy.   This might also be benign anorectal such as hemorrhoids but with him losing some weight over the past month or so I have a feeling that might not be the case.  We will call his primary care, referring physician to have the CT scan report from [facility] 2 or 3 weeks ago sent here for review.  Colonoscopy will be in 2 days from now.    Please see the "Patient Instructions" section for addition details about the plan.   Owens Loffler, MD [facility] 08/24/2018, 10:39 AM  Cc: No ref. provider found

## 2018-08-26 ENCOUNTER — Encounter: Payer: Self-pay | Admitting: Gastroenterology

## 2018-08-26 ENCOUNTER — Ambulatory Visit (AMBULATORY_SURGERY_CENTER): Payer: Self-pay | Admitting: Gastroenterology

## 2018-08-26 VITALS — BP 93/57 | HR 87 | Temp 98.4°F | Resp 13 | Ht 65.0 in | Wt 179.0 lb

## 2018-08-26 DIAGNOSIS — D123 Benign neoplasm of transverse colon: Secondary | ICD-10-CM

## 2018-08-26 DIAGNOSIS — K6289 Other specified diseases of anus and rectum: Secondary | ICD-10-CM

## 2018-08-26 DIAGNOSIS — K635 Polyp of colon: Secondary | ICD-10-CM

## 2018-08-26 DIAGNOSIS — D649 Anemia, unspecified: Secondary | ICD-10-CM

## 2018-08-26 DIAGNOSIS — K625 Hemorrhage of anus and rectum: Secondary | ICD-10-CM

## 2018-08-26 DIAGNOSIS — K51411 Inflammatory polyps of colon with rectal bleeding: Secondary | ICD-10-CM

## 2018-08-26 DIAGNOSIS — K5790 Diverticulosis of intestine, part unspecified, without perforation or abscess without bleeding: Secondary | ICD-10-CM

## 2018-08-26 MED ORDER — SODIUM CHLORIDE 0.9 % IV SOLN: 500.0000 mL | Freq: Once | INTRAVENOUS | Status: DC

## 2018-08-26 NOTE — Patient Instructions (Signed)
Handout on polyps, hemorrhoids, and diverticulosis given   YOU HAD AN ENDOSCOPIC PROCEDURE TODAY AT [facility]:   Refer to the procedure report that was given to you for any specific questions about what was found during the examination.  If the procedure report does not answer your questions, please call your gastroenterologist to clarify.  If you requested that your care partner not be given the details of your procedure findings, then the procedure report has been included in a sealed envelope for you to review at your convenience later.  YOU SHOULD EXPECT: Some feelings of bloating in the abdomen. Passage of more gas than usual.  Walking can help get rid of the air that was put into your GI tract during the procedure and reduce the bloating. If you had a lower endoscopy (such as a colonoscopy or flexible sigmoidoscopy) you may notice spotting of blood in your stool or on the toilet paper. If you underwent a bowel prep for your procedure, you may not have a normal bowel movement for a few days.  Please Note:  You might notice some irritation and congestion in your nose or some drainage.  This is from the oxygen used during your procedure.  There is no need for concern and it should clear up in a day or so.  SYMPTOMS TO REPORT IMMEDIATELY:   Following lower endoscopy (colonoscopy or flexible sigmoidoscopy):  Excessive amounts of blood in the stool  Significant tenderness or worsening of abdominal pains  Swelling of the abdomen that is new, acute  Fever of 100F or higher   For urgent or emergent issues, a gastroenterologist can be reached at any hour by calling [phone].   DIET:  We do recommend a small meal at first, but then you may proceed to your regular diet.  Drink plenty of fluids but you should avoid alcoholic beverages for 24 hours.  ACTIVITY:  You should plan to take it easy for the rest of today and you should NOT DRIVE or use heavy machinery until  tomorrow (because of the sedation medicines used during the test).    FOLLOW UP: Our staff will call the number listed on your records the next business day following your procedure to check on you and address any questions or concerns that you may have regarding the information given to you following your procedure. If we do not reach you, we will leave a message.  However, if you are feeling well and you are not experiencing any problems, there is no need to return our call.  We will assume that you have returned to your regular daily activities without incident.  If any biopsies were taken you will be contacted by phone or by letter within the next 1-3 weeks.  Please call us at [phone] if you have not heard about the biopsies in 3 weeks.    SIGNATURES/CONFIDENTIALITY: You and/or your care partner have signed paperwork which will be entered into your electronic medical record.  These signatures attest to the fact that that the information above on your After Visit Summary has been reviewed and is understood.  Full responsibility of the confidentiality of this discharge information lies with you and/or your care-partner.

## 2018-08-26 NOTE — Progress Notes (Signed)
Called to room to assist during endoscopic procedure.  Patient ID and intended procedure confirmed with present staff. Received instructions for my participation in the procedure from the performing physician.  

## 2018-08-26 NOTE — Progress Notes (Signed)
Report to PACU, RN, vss, BBS= Clear.  

## 2018-08-26 NOTE — Op Note (Signed)
[facility] Patient Name: Troy Stevenson Procedure Date: 08/26/2018 2:27 PM MRN: [id number] Endoscopist: Milus Banister , MD Age: 53 Referring MD:  Date of Birth: 08-23-1965 Gender: Male Account #: [id number] Procedure:                Colonoscopy Indications:              Hematochezia, iron deficiency anemia Medicines:                Monitored Anesthesia Care Procedure:                Pre-Anesthesia Assessment:                           - Prior to the procedure, a History and Physical                            was performed, and patient medications and                            allergies were reviewed. The patient's tolerance of                            previous anesthesia was also reviewed. The risks                            and benefits of the procedure and the sedation                            options and risks were discussed with the patient.                            All questions were answered, and informed consent                            was obtained. Prior Anticoagulants: The patient has                            taken no previous anticoagulant or antiplatelet                            agents. ASA Grade Assessment: II - A patient with                            mild systemic disease. After reviewing the risks                            and benefits, the patient was deemed in                            satisfactory condition to undergo the procedure.                           After obtaining informed consent, the colonoscope  was passed under direct vision. Throughout the                            procedure, the patient's blood pressure, pulse, and                            oxygen saturations were monitored continuously. The                            Colonoscope was introduced through the anus and                            advanced to the the cecum, identified by                            appendiceal orifice and  ileocecal valve. The                            colonoscopy was performed without difficulty. The                            patient tolerated the procedure well. The quality                            of the bowel preparation was good. The ileocecal                            valve, appendiceal orifice, and rectum were                            photographed. Scope In: 2:30:59 PM Scope Out: 2:52:10 PM Scope Withdrawal Time: 0 hours 18 minutes 14 seconds  Total Procedure Duration: 0 hours 21 minutes 11 seconds  Findings:                 Two sessile polyps were found in the transverse                            colon. The polyps were 2 to 5 mm in size. These                            polyps were removed with a cold snare. Resection                            and retrieval were complete. Jar 1.                           Tubular, friable 7-61mm long nodule in the proximal                            anus, just proximal to small internal hemorrhoids                            (see images). I suspect this to be  the source of                            his daily rectal bleeding, anemia and I removed it                            with snare/cautery. Jar 2.                           Left sided diverticulosis.                           The exam was otherwise without abnormality on                            direct and retroflexion views. Complications:            No immediate complications. Estimated blood loss:                            None. Estimated Blood Loss:     Estimated blood loss: none. Impression:               - Two 2 to 5 mm polyps in the transverse colon,                            removed with a cold snare. Resected and retrieved.                            Jar 1                           - Tubular, friable 7-73mm long nodule in the                            proximal anus, just proximal to small internal                            hemorrhoids (see images). I suspect this to be the                             source of his daily rectal bleeding, anemia and I                            removed it with snare/cautery. Jar 2.                           - Left sided diverticulosis.                           - The examination was otherwise normal on direct                            and retroflexion views. Recommendation:           - Patient has a contact number available for  emergencies. The signs and symptoms of potential                            delayed complications were discussed with the                            patient. Return to normal activities tomorrow.                            Written discharge instructions were provided to the                            patient.                           - Resume previous diet.                           - Continue present medications.                           You will receive a letter within 2-3 weeks with the                            pathology results and my final recommendations.                           If the polyp(s) is proven to be 'pre-cancerous' on                            pathology, you will need repeat colonoscopy in 7                            years. If the polyp(s) is NOT 'precancerous' on                            pathology then you should repeat colon cancer                            screening in 10 years with colonoscopy without need                            for colon cancer screening by any method prior to                            then (including stool testing). Milus Banister, MD 08/26/2018 2:58:10 PM This report has been signed electronically.

## 2018-08-29 ENCOUNTER — Telehealth: Payer: Self-pay

## 2018-08-29 NOTE — Telephone Encounter (Signed)
  Follow up Call-  Call back number 08/26/2018  Post procedure Call Back phone  # [phone]  Permission to leave phone message Yes  Some recent data might be hidden     Patient questions:  Do you have a fever, pain , or abdominal swelling? No. Pain Score  0 *  Have you tolerated food without any problems? Yes.    Have you been able to return to your normal activities? Yes.    Do you have any questions about your discharge instructions: Diet   No. Medications  No. Follow up visit  No.  Do you have questions or concerns about your Care? No.  Actions: * If pain score is 4 or above: No action needed, pain <4.

## 2018-09-06 ENCOUNTER — Encounter: Payer: Self-pay | Admitting: Gastroenterology

## 2018-11-02 ENCOUNTER — Telehealth: Payer: Self-pay | Admitting: Gastroenterology

## 2018-11-02 NOTE — Telephone Encounter (Signed)
The pt was advised that his procedure was 2 months ago and very unlikely that the procedure is the cause of his lack of sleep.  He will call his PCP to discuss.

## 2018-11-02 NOTE — Telephone Encounter (Signed)
Pt states that since his procedure he has not been able to sleep well, he is wondering if it could be related to the sedation. Pls call him.

## 2019-03-11 IMAGING — DX DG CHEST 2V
2 series · 2 of 2 positions shown · non-contrast
Comparison: None.

CLINICAL DATA: Patient reports since [facility] he has felt weak,
dizziness, body aches, headache, fever. Reports cough that started
today.

EXAM:
CHEST  2 VIEW

[w chest pa]
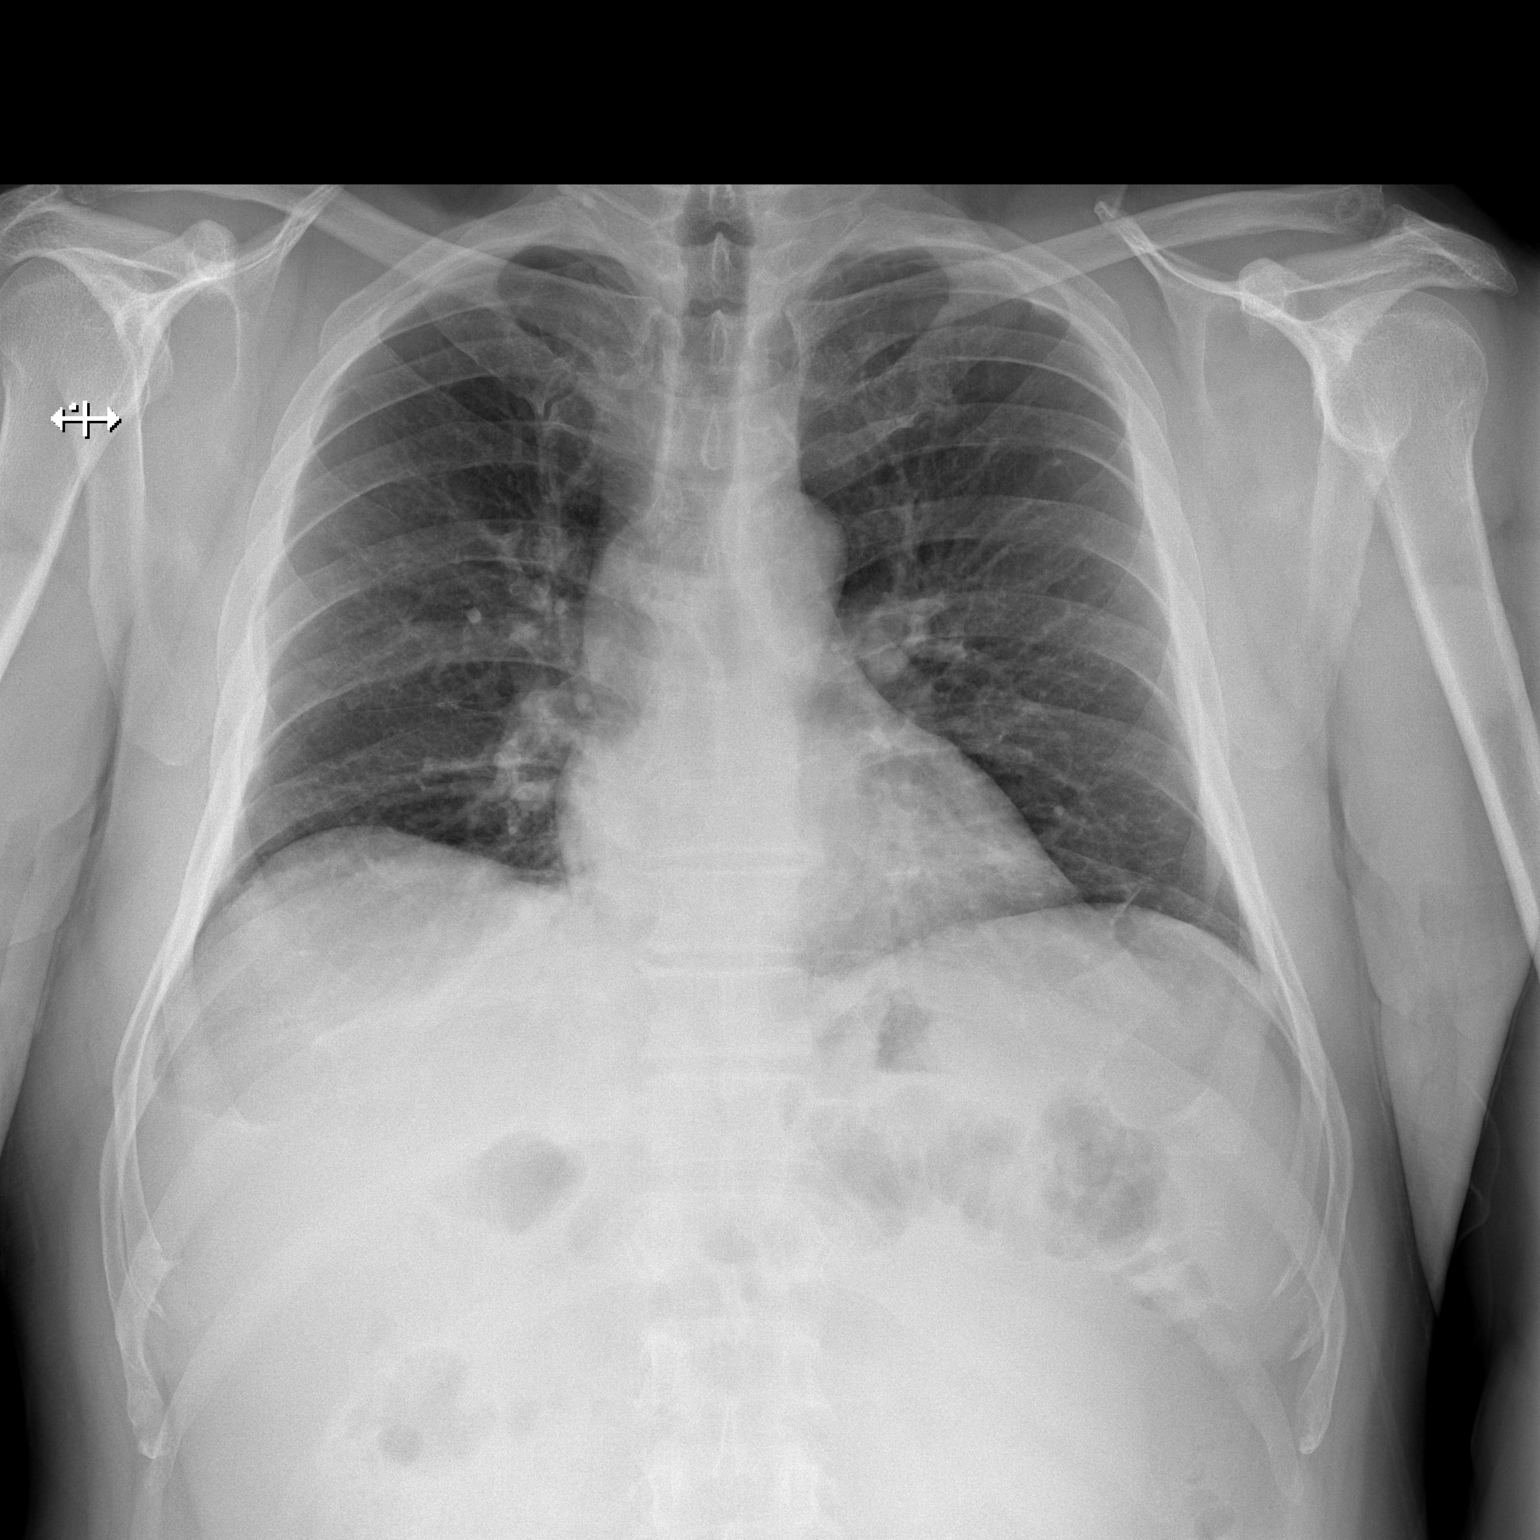

[w chest lat]
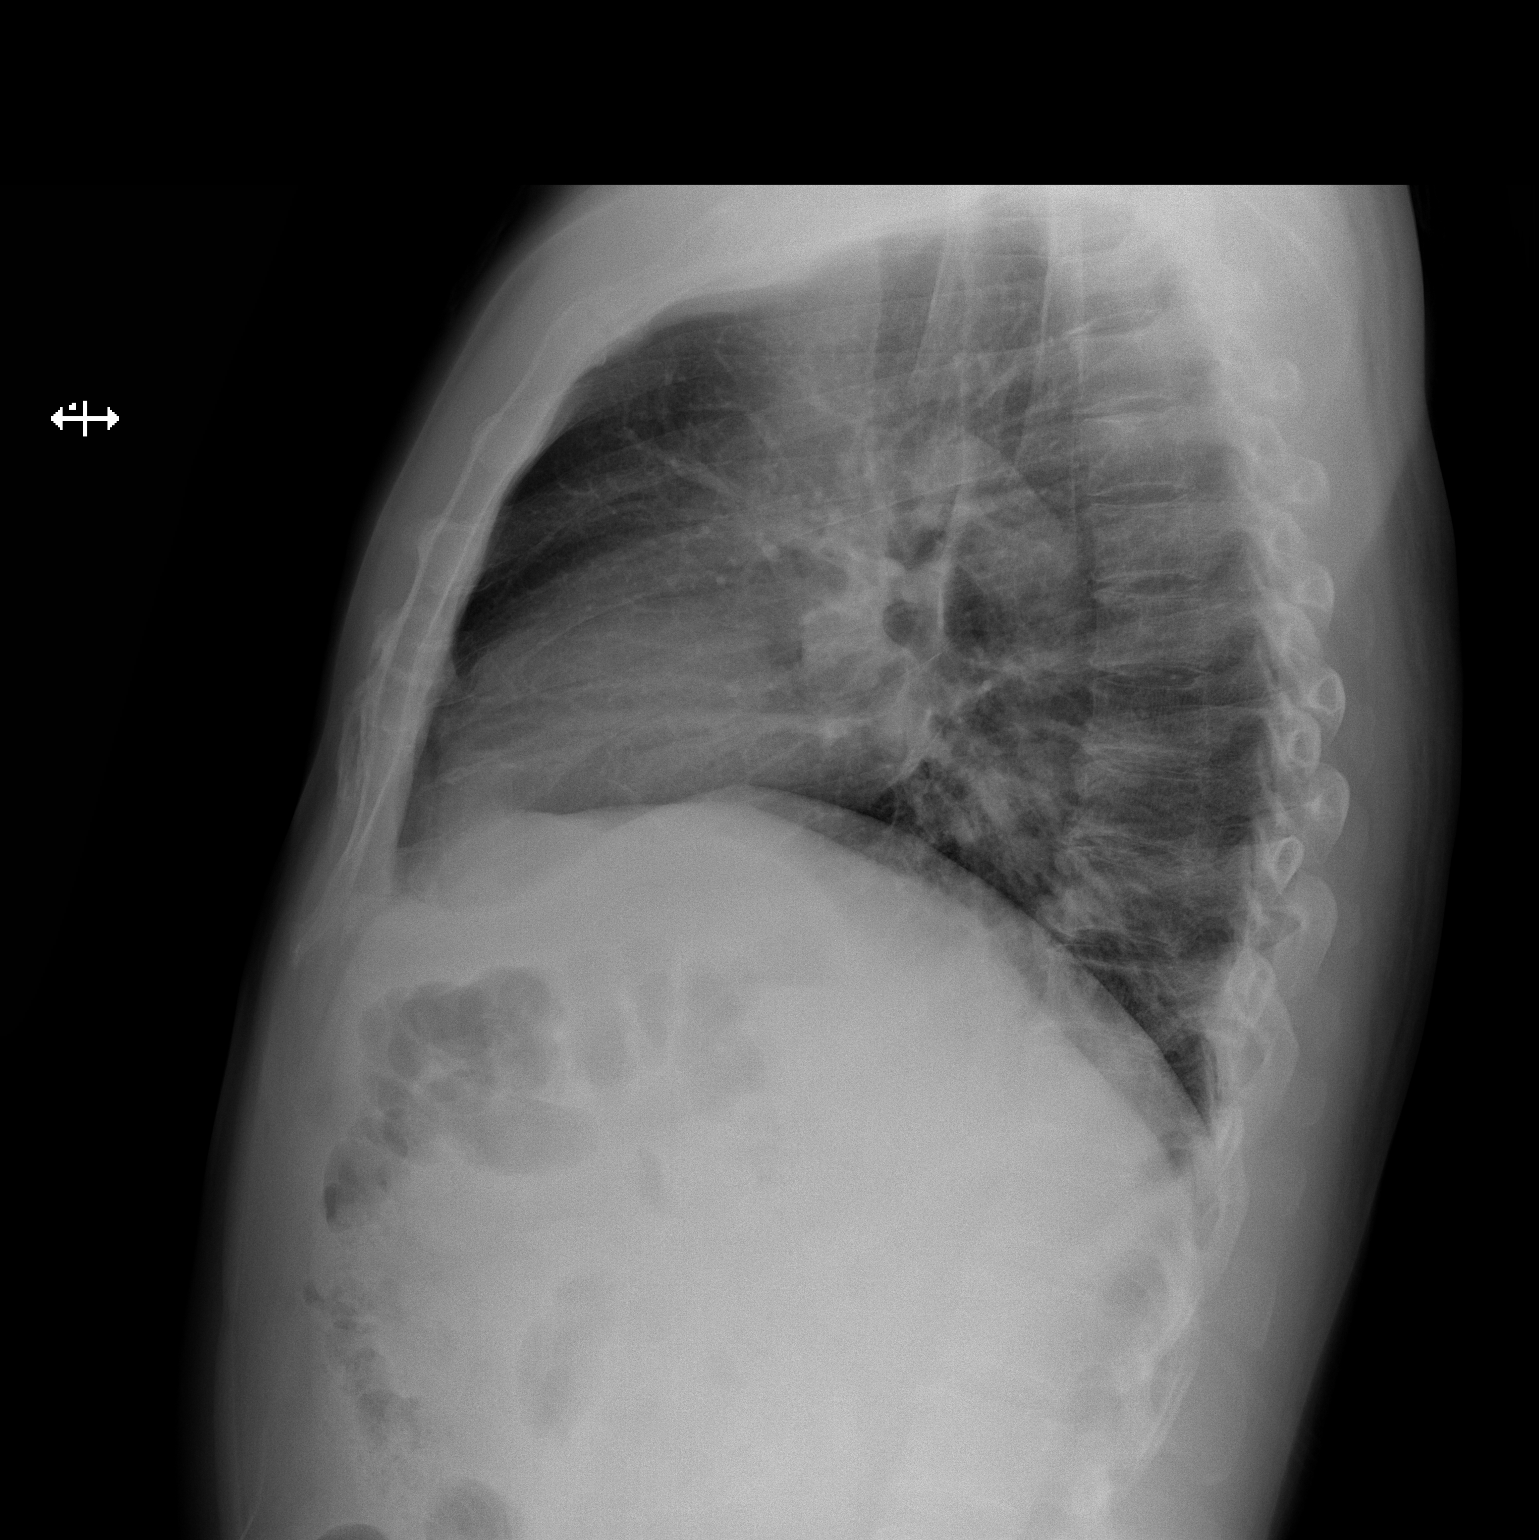

[2 of 2 positions shown; findings below may reference images not displayed]

FINDINGS: Normal mediastinum and cardiac silhouette. Normal pulmonary
vasculature. No evidence of effusion, infiltrate, or pneumothorax.
No acute bony abnormality.
IMPRESSION: No acute cardiopulmonary process.

## 2021-09-10 DIAGNOSIS — L03115 Cellulitis of right lower limb: Secondary | ICD-10-CM | POA: Diagnosis not present

## 2021-12-15 ENCOUNTER — Ambulatory Visit: Payer: Self-pay | Admitting: Emergency Medicine

## 2022-02-27 DIAGNOSIS — R52 Pain, unspecified: Secondary | ICD-10-CM | POA: Diagnosis not present

## 2022-02-27 DIAGNOSIS — Z20822 Contact with and (suspected) exposure to covid-19: Secondary | ICD-10-CM | POA: Diagnosis not present

## 2022-02-27 DIAGNOSIS — R519 Headache, unspecified: Secondary | ICD-10-CM | POA: Diagnosis not present

## 2022-06-19 DIAGNOSIS — M79674 Pain in right toe(s): Secondary | ICD-10-CM | POA: Diagnosis not present

## 2022-08-18 DIAGNOSIS — R5383 Other fatigue: Secondary | ICD-10-CM | POA: Diagnosis not present

## 2022-08-19 ENCOUNTER — Other Ambulatory Visit: Payer: Self-pay

## 2022-08-19 ENCOUNTER — Inpatient Hospital Stay (HOSPITAL_COMMUNITY)
Admission: EM | Admit: 2022-08-19 | Discharge: 2022-08-23 | DRG: 395 | Disposition: A | Discharge status: Home / Self Care | Payer: 59 | Attending: Internal Medicine | Admitting: Internal Medicine

## 2022-08-19 ENCOUNTER — Encounter (HOSPITAL_COMMUNITY): Payer: Self-pay

## 2022-08-19 DIAGNOSIS — D649 Anemia, unspecified: Secondary | ICD-10-CM | POA: Diagnosis not present

## 2022-08-19 DIAGNOSIS — K5730 Diverticulosis of large intestine without perforation or abscess without bleeding: Secondary | ICD-10-CM | POA: Diagnosis present

## 2022-08-19 DIAGNOSIS — K625 Hemorrhage of anus and rectum: Principal | ICD-10-CM

## 2022-08-19 DIAGNOSIS — K922 Gastrointestinal hemorrhage, unspecified: Secondary | ICD-10-CM | POA: Diagnosis present

## 2022-08-19 DIAGNOSIS — K219 Gastro-esophageal reflux disease without esophagitis: Secondary | ICD-10-CM | POA: Diagnosis present

## 2022-08-19 DIAGNOSIS — R1310 Dysphagia, unspecified: Secondary | ICD-10-CM | POA: Diagnosis present

## 2022-08-19 DIAGNOSIS — K317 Polyp of stomach and duodenum: Secondary | ICD-10-CM

## 2022-08-19 DIAGNOSIS — R000 Tachycardia, unspecified: Secondary | ICD-10-CM | POA: Diagnosis not present

## 2022-08-19 DIAGNOSIS — D509 Iron deficiency anemia, unspecified: Secondary | ICD-10-CM | POA: Diagnosis present

## 2022-08-19 DIAGNOSIS — Z79899 Other long term (current) drug therapy: Secondary | ICD-10-CM

## 2022-08-19 DIAGNOSIS — Z86010 Personal history of colonic polyps: Secondary | ICD-10-CM

## 2022-08-19 DIAGNOSIS — K640 First degree hemorrhoids: Secondary | ICD-10-CM | POA: Diagnosis not present

## 2022-08-19 DIAGNOSIS — F419 Anxiety disorder, unspecified: Secondary | ICD-10-CM | POA: Diagnosis present

## 2022-08-19 LAB — COMPREHENSIVE METABOLIC PANEL
ALT: 12 U/L (ref 0–44)
AST: 19 U/L (ref 15–41)
Albumin: 3.9 g/dL (ref 3.5–5.0)
Alkaline Phosphatase: 88 U/L (ref 38–126)
Anion gap: 10 (ref 5–15)
BUN: 6 mg/dL (ref 6–20)
CO2: 24 mmol/L (ref 22–32)
Calcium: 8.9 mg/dL (ref 8.9–10.3)
Chloride: 100 mmol/L (ref 98–111)
Creatinine, Ser: 0.92 mg/dL (ref 0.61–1.24)
GFR, Estimated: >60 mL/min (ref >60)
Glucose, Bld: 128 mg/dL — ABNORMAL HIGH (ref 70–99)
Potassium: 3.8 mmol/L (ref 3.5–5.1)
Sodium: 134 mmol/L — ABNORMAL LOW (ref 135–145)
Total Bilirubin: 0.5 mg/dL (ref 0.3–1.2)
Total Protein: 7.3 g/dL (ref 6.5–8.1)

## 2022-08-19 LAB — CBC WITH DIFFERENTIAL/PLATELET
Abs Immature Granulocytes: 0.02 10*3/uL (ref 0.00–0.07)
Basophils Absolute: 0.1 10*3/uL (ref 0.0–0.1)
Basophils Relative: 1 %
Eosinophils Absolute: 0.1 10*3/uL (ref 0.0–0.5)
Eosinophils Relative: 1 %
HCT: 23.3 % — ABNORMAL LOW (ref 39.0–52.0)
Hemoglobin: 5.8 g/dL — CL (ref 13.0–17.0)
Immature Granulocytes: 0 %
Lymphocytes Relative: 17 %
Lymphs Abs: 1.3 10*3/uL (ref 0.7–4.0)
MCH: 15 pg — ABNORMAL LOW (ref 26.0–34.0)
MCHC: 24.9 g/dL — ABNORMAL LOW (ref 30.0–36.0)
MCV: 60.4 fL — ABNORMAL LOW (ref 80.0–100.0)
Monocytes Absolute: 0.6 10*3/uL (ref 0.1–1.0)
Monocytes Relative: 7 %
Neutro Abs: 5.6 10*3/uL (ref 1.7–7.7)
Neutrophils Relative %: 74 %
Platelets: 309 10*3/uL (ref 150–400)
RBC: 3.86 MIL/uL — ABNORMAL LOW (ref 4.22–5.81)
RDW: 21 % — ABNORMAL HIGH (ref 11.5–15.5)
WBC: 7.6 10*3/uL (ref 4.0–10.5)
nRBC: 0.3 % — ABNORMAL HIGH (ref 0.0–0.2)

## 2022-08-19 LAB — PROTIME-INR
INR: 1.1 (ref 0.8–1.2)
Prothrombin Time: 14.1 seconds (ref 11.4–15.2)

## 2022-08-19 LAB — POC OCCULT BLOOD, ED: Fecal Occult Bld: NEGATIVE

## 2022-08-19 LAB — PREPARE RBC (CROSSMATCH)

## 2022-08-19 MED ORDER — SODIUM CHLORIDE 0.9% IV SOLUTION: Freq: Once | INTRAVENOUS | Status: AC

## 2022-08-19 NOTE — ED Provider Triage Note (Signed)
Emergency Medicine Provider Triage Evaluation Note  Troy Stevenson , a 57 y.o. male  was evaluated in triage.  Pt complains of fatigue, this been going on for the last few months.  Seen by his doctor yesterday, told to go to the ER because his hemoglobin was 6.2.  Patient states he had dark and starry stools 1 week in January and in December but has not had any since then.  Not on any blood thinners, no history of coagulopathy, has never required a transfusion previously.    Review of Systems  Per HPI  Physical Exam  BP 139/68 (BP Location: Right Arm)   Pulse (!) 107   Temp 98.8 F (37.1 C) (Oral)   Resp (!) 26   SpO2 100%  Gen:   Awake, no distress   Resp:  Normal effort  MSK:   Moves extremities without difficulty  Other:  Slightly tachycardic, abdomen is nontender  Medical Decision Making  Medically screening exam initiated at 7:40 PM.  Appropriate orders placed.  KRISTAIN ZUNICH was informed that the remainder of the evaluation will be completed by another provider, this initial triage assessment does not replace that evaluation, and the importance of remaining in the ED until their evaluation is complete.     Sherrill Raring, PA-C 08/19/22 1941

## 2022-08-19 NOTE — ED Provider Notes (Signed)
[facility] Provider Note   CSN: [id number] Arrival date & time: 08/19/22  1928     History Colon polyps Chief Complaint  Patient presents with   abnormal  labs    Troy Stevenson is a 57 y.o. male.  57 y.o male with a PMH of rectal bleeding, colon polyps presents to the ED with a chief complaint of fatigue.  Patient reports he has a prior history of colon polyps, he did have cauterization of these approximately 3 years ago with a anoscopy as well.  He reports that the bleeding returned for the past 1 to 2 weeks, he has had some sporadic bleeding every time he wipes.  He does report having overall worsening fatigue, thought this was likely due to his stress levels.  He has not seen any visible melena, however reports being evaluated by a primary care physician yesterday and had labs remarkable for a low hemoglobin.  He also endorses alcohol use, drinks 3-4 beers every night, but reports no liquor at all.  He is also endorsing some nausea and some loss in appetite along with weight loss of 13 pounds over the past 2 months.  He thought symptoms were likely due to stress.  No nausea, vomiting, prior history of transfusion or anemia.    The history is provided by the patient and medical records.       Home Medications Prior to Admission medications   Medication Sig Start Date End Date Taking? Authorizing Provider  omeprazole (PRILOSEC) 40 MG capsule Take 1 capsule by mouth daily.    [provider]      Allergies    Patient has no known allergies.    Review of Systems   Review of Systems  Constitutional:  Negative for chills and fever.  Respiratory:  Negative for shortness of breath.   Cardiovascular:  Negative for chest pain.  Gastrointestinal:  Positive for blood in stool and nausea. Negative for abdominal pain and vomiting.  Genitourinary:  Negative for flank pain.  Musculoskeletal:  Negative for back pain.  All other  systems reviewed and are negative.   Physical Exam Updated Vital Signs BP 129/76 (BP Location: Right Arm)   Pulse 97   Temp 98.6 F (37 C) (Oral)   Resp 15   Ht 5' 5"$  (1.651 m)   Wt 75.8 kg   SpO2 100%   BMI 27.79 kg/m  Physical Exam Vitals and nursing note reviewed. Exam conducted with a chaperone present.  Constitutional:      Appearance: Normal appearance.  HENT:     Head: Normocephalic and atraumatic.     Mouth/Throat:     Mouth: Mucous membranes are moist.  Eyes:     Comments: Conjunctiva is pale.   Cardiovascular:     Rate and Rhythm: Normal rate.  Pulmonary:     Effort: Pulmonary effort is normal.     Breath sounds: No wheezing.  Abdominal:     General: Abdomen is flat.     Tenderness: There is no abdominal tenderness.  Genitourinary:    Comments: Rectal exam with no gross blood noted. Chaperoned by RN.  Musculoskeletal:     Cervical back: Normal range of motion and neck supple.  Skin:    General: Skin is warm and dry.  Neurological:     Mental Status: He is alert and oriented to person, place, and time.     ED Results / Procedures / Treatments   Labs (all  labs ordered are listed, but only abnormal results are displayed) Labs Reviewed  COMPREHENSIVE METABOLIC PANEL - Abnormal; Notable for the following components:      Result Value   Sodium 134 (*)    Glucose, Bld 128 (*)    All other components within normal limits  CBC WITH DIFFERENTIAL/PLATELET - Abnormal; Notable for the following components:   RBC 3.86 (*)    Hemoglobin 5.8 (*)    HCT 23.3 (*)    MCV 60.4 (*)    MCH 15.0 (*)    MCHC 24.9 (*)    RDW 21.0 (*)    nRBC 0.3 (*)    All other components within normal limits  PROTIME-INR  VITAMIN B12  FOLATE  IRON AND TIBC  FERRITIN  RETICULOCYTES  POC OCCULT BLOOD, ED  TYPE AND SCREEN  PREPARE RBC (CROSSMATCH)    EKG EKG Interpretation  Date/Time:  Wednesday August 19 2022 22:03:28 EST Ventricular Rate:  109 PR Interval:  151 QRS  Duration: 87 QT Interval:  330 QTC Calculation: 445 R Axis:   62 Text Interpretation: Sinus tachycardia Confirmed by Lennice Sites ([id number]) on 08/19/2022 10:06:32 PM  Radiology No results found.  Procedures .Critical Care  Performed by: Janeece Fitting, PA-C Authorized by: Janeece Fitting, PA-C   Critical care provider statement:    Critical care time (minutes):  45   Critical care start time:  08/19/2022 10:00 PM   Critical care end time:  08/19/2022 10:45 PM   Critical care was necessary to treat or prevent imminent or life-threatening deterioration of the following conditions:  Circulatory failure   Critical care was time spent personally by me on the following activities:  Development of treatment plan with patient or surrogate, discussions with consultants, evaluation of patient's response to treatment, examination of patient, ordering and review of laboratory studies, ordering and review of radiographic studies, ordering and performing treatments and interventions, pulse oximetry, re-evaluation of patient's condition and review of old charts     Medications Ordered in ED Medications  0.9 %  sodium chloride infusion (Manually program via Guardrails IV Fluids) ( Intravenous New Bag/Given 08/19/22 2242)    ED Course/ Medical Decision Making/ A&P Clinical Course as of 08/19/22 2308  Wed Aug 19, 2022  2250 Hemoglobin(!!): 5.8 [JS]    Clinical Course User Index [JS] Janeece Fitting, PA-C                             Medical Decision Making  This patient presents to the ED for concern of fatigue, this involves a number of treatment options, and is a complaint that carries with it a high risk of complications and morbidity.  The differential diagnosis includes anemia, versus GI bleed.    Co morbidities: Discussed in HPI   Brief History:  Patient presented the ED with increased fatigue over the last 1 to 3 weeks,   EMR reviewed including pt PMHx, past surgical history and past visits  to ER.   See HPI for more details   Lab Tests:  I ordered and independently interpreted labs.  The pertinent results include:    Labs notable for CBC with no leukocytosis.  Hemoglobin is low at 5.8, prior records show a normal hemoglobin.  CMP with no electrolyte abnormality.  Creatinine levels within normal limits.  LFTs are unremarkable.  Occult blood is negative.   Imaging Studies:  No imaging studies ordered for this patient   Cardiac Monitoring:  The patient was maintained on a cardiac monitor.  I personally viewed and interpreted the cardiac monitored which showed an underlying rhythm of: sinus tachycardia EKG non-ischemic  Critical Interventions:  Blood transfusion with a critical hemoglobin os 5.8   Reevaluation:  After the interventions noted above I re-evaluated patient and found that they have :stayed the same   Social Determinants of Health:  The patient's social determinants of health were a factor in the care of this patient   Problem List / ED Course:  Patient here with increase in fatigue over the last 1 to 2 weeks, previous history of colon polyps, had colonoscopy prior to the COVID-19 pandemic.  Reports she has had some rectal bleeding on and off over the last couple of weeks.  Has had increase in fatigue therefore he schedule an appoint with PCP.  Evaluated yesterday and told that his hemoglobin was low.  Arrived to the ED today with a hemoglobin of 5.8, no prior history of anemia or prior blood transfusion.  CMP with no electrolyte abnormality.  Current levels within normal limits.  [employer] unremarkable.  No history of alcohol abuse, rectal exam performed by me without any gross blood noted.  Type and screen were obtained and transfusion of 1 L of red blood cells was placed.  Did request a consultation with gastroenterology, however have not received a call from them.  I did speak to hospitalist Dr. Alcario Drought who will admit patient for further evaluation.   Patient is hemodynamically stable for admission at this time.  Dispostion:  After consideration of the diagnostic results and the patients response to treatment, I feel that the patent would benefit from admission for further management.     Portions of this note were generated with [employer]. Dictation errors may occur despite best attempts at proofreading.   Final Clinical Impression(s) / ED Diagnoses Final diagnoses:  Rectal bleeding  Low hemoglobin    Rx / DC Orders ED Discharge Orders     None         Janeece Fitting, PA-C 08/19/22 2308    Lennice Sites, DO 08/19/22 2311

## 2022-08-19 NOTE — ED Triage Notes (Signed)
Pt arrived from home via POV sent from PCP office for low hgb levels, per pt 6.2 yesterday. Pt states that he has felt excessively tired x 3 weeks.

## 2022-08-20 DIAGNOSIS — K649 Unspecified hemorrhoids: Secondary | ICD-10-CM | POA: Diagnosis not present

## 2022-08-20 DIAGNOSIS — K922 Gastrointestinal hemorrhage, unspecified: Secondary | ICD-10-CM

## 2022-08-20 DIAGNOSIS — D649 Anemia, unspecified: Secondary | ICD-10-CM | POA: Diagnosis not present

## 2022-08-20 DIAGNOSIS — D509 Iron deficiency anemia, unspecified: Secondary | ICD-10-CM | POA: Diagnosis not present

## 2022-08-20 DIAGNOSIS — K317 Polyp of stomach and duodenum: Secondary | ICD-10-CM | POA: Diagnosis not present

## 2022-08-20 LAB — HIV ANTIBODY (ROUTINE TESTING W REFLEX): HIV Screen 4th Generation wRfx: NONREACTIVE

## 2022-08-20 LAB — RETICULOCYTES
Immature Retic Fract: 17.9 % — ABNORMAL HIGH (ref 2.3–15.9)
RBC.: 3.77 MIL/uL — ABNORMAL LOW (ref 4.22–5.81)
Retic Count, Absolute: 26.1 10*3/uL (ref 19.0–186.0)
Retic Ct Pct: 0.7 % (ref 0.4–3.1)

## 2022-08-20 LAB — TYPE AND SCREEN
ABO/RH(D): O POS
Antibody Screen: NEGATIVE

## 2022-08-20 LAB — IRON AND TIBC
Iron: 17 ug/dL — ABNORMAL LOW (ref 45–182)
Saturation Ratios: 3 % — ABNORMAL LOW (ref 17.9–39.5)
TIBC: 535 ug/dL — ABNORMAL HIGH (ref 250–450)
UIBC: 518 ug/dL

## 2022-08-20 LAB — BASIC METABOLIC PANEL
Anion gap: 12 (ref 5–15)
BUN: 14 mg/dL (ref 6–20)
CO2: 22 mmol/L (ref 22–32)
Calcium: 8.7 mg/dL — ABNORMAL LOW (ref 8.9–10.3)
Chloride: 100 mmol/L (ref 98–111)
Creatinine, Ser: 0.73 mg/dL (ref 0.61–1.24)
GFR, Estimated: >60 mL/min (ref >60)
Glucose, Bld: 97 mg/dL (ref 70–99)
Potassium: 3.8 mmol/L (ref 3.5–5.1)
Sodium: 134 mmol/L — ABNORMAL LOW (ref 135–145)

## 2022-08-20 LAB — CBC
HCT: 24.7 % — ABNORMAL LOW (ref 39.0–52.0)
Hemoglobin: 6.5 g/dL — CL (ref 13.0–17.0)
MCH: 16.5 pg — ABNORMAL LOW (ref 26.0–34.0)
MCHC: 26.3 g/dL — ABNORMAL LOW (ref 30.0–36.0)
MCV: 62.7 fL — ABNORMAL LOW (ref 80.0–100.0)
Platelets: 268 10*3/uL (ref 150–400)
RBC: 3.94 MIL/uL — ABNORMAL LOW (ref 4.22–5.81)
RDW: 24.3 % — ABNORMAL HIGH (ref 11.5–15.5)
WBC: 5.8 10*3/uL (ref 4.0–10.5)
nRBC: 0.7 % — ABNORMAL HIGH (ref 0.0–0.2)

## 2022-08-20 LAB — FERRITIN: Ferritin: 1 ng/mL — ABNORMAL LOW (ref 24–336)

## 2022-08-20 LAB — HEMOGLOBIN AND HEMATOCRIT, BLOOD
HCT: 24.2 % — ABNORMAL LOW (ref 39.0–52.0)
HCT: 30.6 % — ABNORMAL LOW (ref 39.0–52.0)
Hemoglobin: 6.6 g/dL — CL (ref 13.0–17.0)
Hemoglobin: 8.8 g/dL — ABNORMAL LOW (ref 13.0–17.0)

## 2022-08-20 LAB — VITAMIN B12: Vitamin B-12: 555 pg/mL (ref 180–914)

## 2022-08-20 LAB — PREPARE RBC (CROSSMATCH)

## 2022-08-20 LAB — FOLATE: Folate: 22.7 ng/mL (ref >5.9)

## 2022-08-20 MED ORDER — ORAL CARE MOUTH RINSE: 15.0000 mL | OROMUCOSAL | Status: DC | PRN

## 2022-08-20 MED ORDER — PEG-KCL-NACL-NASULF-NA ASC-C 100 G PO SOLR: 1.0000 | Freq: Once | ORAL | Status: DC

## 2022-08-20 MED ORDER — PEG-KCL-NACL-NASULF-NA ASC-C 100 G PO SOLR
0.5000 | Freq: Once | ORAL | Status: AC
  Administered 2022-08-20: 100 g via ORAL
  Filled 2022-08-20: qty 1

## 2022-08-20 MED ORDER — ACETAMINOPHEN 650 MG RE SUPP: 650.0000 mg | Freq: Four times a day (QID) | RECTAL | Status: DC | PRN

## 2022-08-20 MED ORDER — PEG-KCL-NACL-NASULF-NA ASC-C 100 G PO SOLR
0.5000 | Freq: Once | ORAL | Status: AC
  Administered 2022-08-20: 100 g via ORAL

## 2022-08-20 MED ORDER — BISACODYL 5 MG PO TBEC
10.0000 mg | DELAYED_RELEASE_TABLET | Freq: Once | ORAL | Status: AC
  Administered 2022-08-20: 10 mg via ORAL
  Filled 2022-08-20: qty 2

## 2022-08-20 MED ORDER — PANTOPRAZOLE SODIUM 40 MG IV SOLR
40.0000 mg | Freq: Two times a day (BID) | INTRAVENOUS | Status: DC
  Administered 2022-08-20 – 2022-08-23 (×7): 40 mg via INTRAVENOUS
  Filled 2022-08-20 (×7): qty 10

## 2022-08-20 MED ORDER — ACETAMINOPHEN 325 MG PO TABS: 650.0000 mg | ORAL_TABLET | Freq: Four times a day (QID) | ORAL | Status: DC | PRN

## 2022-08-20 MED ORDER — ONDANSETRON HCL 4 MG/2ML IJ SOLN: 4.0000 mg | Freq: Four times a day (QID) | INTRAMUSCULAR | Status: DC | PRN

## 2022-08-20 MED ORDER — SODIUM CHLORIDE 0.9 % IV SOLN: INTRAVENOUS | Status: DC

## 2022-08-20 MED ORDER — SODIUM CHLORIDE 0.9% IV SOLUTION: Freq: Once | INTRAVENOUS | Status: AC

## 2022-08-20 MED ORDER — ONDANSETRON HCL 4 MG PO TABS: 4.0000 mg | ORAL_TABLET | Freq: Four times a day (QID) | ORAL | Status: DC | PRN

## 2022-08-20 NOTE — Assessment & Plan Note (Addendum)
Looks like iron deficiency anemia, suspect a more chronic presentation.  Probably chronic GIB. Known h/o internal hemorrhoids. Also h/o bleeding polyp in 2020 Got 1u PRBC transfusion in ED Ordering 2nd unit of PRBC since HGB 6.6 post transfusion still. GI consulted and will see in AM Anemia pnl confirms iron deficiency

## 2022-08-20 NOTE — Hospital Course (Addendum)
57 y.o. male with medical history significant of bleeding colon polyps and internal hemorrhoids in 2020, with h/o bleeding polyp in 2020, 2 polyps removed, also internal hemorrhoids, next repeat colonoscopy was going to be either 7 or 10 years depending on wether pathology was precancerous or not (looks like it was non-precancerous from what I can tell) presented w/ intermittent rectal bleeding when he wipes for the past 1-2 weeks.  Worsening fatigue in that time frame.  Does drink 3-4 beers daily.  Appetite loss and wt loss of 13 lbs over past 2 months.  No N/V prior transfusion. In the ED anemic hemoglobin 5.8  gm, previous hemoglobin 9.7 on admission 08/20/2018, GI was consulted, and admitted for further management. SEEN by GI- planning for colo and EGD. S/p 2 units prbc and HB has improved. Found to have severe anemia- iron infusion ordered as well.

## 2022-08-20 NOTE — ED Notes (Signed)
ED TO INPATIENT HANDOFF REPORT  ED Nurse Name and Phone #: Leana Gamer [phone]  S Name/Age/Gender Troy Stevenson 57 y.o. male Room/Bed: 042C/042C  Code Status   Code Status: Full Code  Home/SNF/Other Home Patient oriented to: self, place, time, and situation Is this baseline? Yes   Triage Complete: Triage complete  Chief Complaint Symptomatic anemia [D64.9]  Triage Note Pt arrived from home via POV sent from PCP office for low hgb levels, per pt 6.2 yesterday. Pt states that he has felt excessively tired x 3 weeks.    Allergies No Known Allergies  Level of Care/Admitting Diagnosis ED Disposition     ED Disposition  Admit   Condition  --   Comment  Hospital Area: [place] [[id number]]  Level of Care: Telemetry Medical [104]  May place patient in observation at [facility] or [facility] if equivalent level of care is available:: No  Covid Evaluation: Asymptomatic - no recent exposure (last 10 days) testing not required  Diagnosis: Symptomatic anemia [id number]  Admitting Physician: Etta Quill [id number]  Attending Physician: Etta Quill [phone]          B Medical/Surgery History Past Medical History:  Diagnosis Date   Anxiety    Hepatitis    History reviewed. No pertinent surgical history.   A IV Location/Drains/Wounds Patient Lines/Drains/Airways Status     Active Line/Drains/Airways     Name Placement date Placement time Site Days   Peripheral IV 03/01/17 Right Antecubital 03/01/17  1658  Antecubital  1998            Intake/Output Last 24 hours  Intake/Output Summary (Last 24 hours) at 08/20/2022 1016 Last data filed at 08/20/2022 0019 Gross per 24 hour  Intake 180 ml  Output --  Net 180 ml    Labs/Imaging Results for orders placed or performed during the hospital encounter of 08/19/22 (from the past 48 hour(s))  Comprehensive metabolic panel     Status: Abnormal   Collection Time: 08/19/22  7:44 PM   Result Value Ref Range   Sodium 134 (L) 135 - 145 mmol/L   Potassium 3.8 3.5 - 5.1 mmol/L   Chloride 100 98 - 111 mmol/L   CO2 24 22 - 32 mmol/L   Glucose, Bld 128 (H) 70 - 99 mg/dL    Comment: Glucose reference range applies only to samples taken after fasting for at least 8 hours.   BUN 6 6 - 20 mg/dL   Creatinine, Ser 0.92 0.61 - 1.24 mg/dL   Calcium 8.9 8.9 - 10.3 mg/dL   Total Protein 7.3 6.5 - 8.1 g/dL   Albumin 3.9 3.5 - 5.0 g/dL   AST 19 15 - 41 U/L   ALT 12 0 - 44 U/L   Alkaline Phosphatase 88 38 - 126 U/L   Total Bilirubin 0.5 0.3 - 1.2 mg/dL   GFR, Estimated >60 >60 mL/min    Comment: (NOTE) Calculated using the CKD-EPI Creatinine Equation (2021)    Anion gap 10 5 - 15    Comment: Performed at [facility] [address], [place], [place] [address]  CBC with Differential     Status: Abnormal   Collection Time: 08/19/22  7:44 PM  Result Value Ref Range   WBC 7.6 4.0 - 10.5 K/uL   RBC 3.86 (L) 4.22 - 5.81 MIL/uL   Hemoglobin 5.8 (LL) 13.0 - 17.0 g/dL    Comment: REPEATED TO VERIFY Reticulocyte Hemoglobin testing may be clinically  indicated, consider ordering this additional test [id number] THIS CRITICAL RESULT HAS VERIFIED AND BEEN CALLED TO GRACE TATE, RN BY SHAY EKDAHL ON 02 14 2024 AT 2053, AND HAS BEEN READ BACK.     HCT 23.3 (L) 39.0 - 52.0 %   MCV 60.4 (L) 80.0 - 100.0 fL   MCH 15.0 (L) 26.0 - 34.0 pg   MCHC 24.9 (L) 30.0 - 36.0 g/dL   RDW 21.0 (H) 11.5 - 15.5 %   Platelets 309 150 - 400 K/uL    Comment: REPEATED TO VERIFY   nRBC 0.3 (H) 0.0 - 0.2 %   Neutrophils Relative % 74 %   Neutro Abs 5.6 1.7 - 7.7 K/uL   Lymphocytes Relative 17 %   Lymphs Abs 1.3 0.7 - 4.0 K/uL   Monocytes Relative 7 %   Monocytes Absolute 0.6 0.1 - 1.0 K/uL   Eosinophils Relative 1 %   Eosinophils Absolute 0.1 0.0 - 0.5 K/uL   Basophils Relative 1 %   Basophils Absolute 0.1 0.0 - 0.1 K/uL   Immature Granulocytes 0 %   Abs Immature Granulocytes 0.02 0.00 - 0.07 K/uL     Comment: Performed at [facility], [address], [place], [place] [address]  Protime-INR     Status: None   Collection Time: 08/19/22  7:44 PM  Result Value Ref Range   Prothrombin Time 14.1 11.4 - 15.2 seconds   INR 1.1 0.8 - 1.2    Comment: (NOTE) INR goal varies based on device and disease states. Performed at [facility], [place] [address] [place], [place] [address]   Type and screen [place]     Status: None (Preliminary result)   Collection Time: 08/19/22  7:44 PM  Result Value Ref Range   ABO/RH(D) O POS    Antibody Screen NEG    Sample Expiration 08/22/2022,2359    Unit Number [id number]    Blood Component Type RBC LR PHER1    Unit division 00    Status of Unit ISSUED    Transfusion Status OK TO TRANSFUSE    Crossmatch Result Compatible    Unit Number [id number]    Blood Component Type RED CELLS,LR    Unit division 00    Status of Unit ISSUED    Transfusion Status OK TO TRANSFUSE    Crossmatch Result      Compatible Performed at [facility], [place] [address], [place], [place] [address]   POC occult blood, ED     Status: None   Collection Time: 08/19/22 10:15 PM  Result Value Ref Range   Fecal Occult Bld NEGATIVE NEGATIVE  Prepare RBC (crossmatch)     Status: None   Collection Time: 08/19/22 10:23 PM  Result Value Ref Range   Order Confirmation      ORDER PROCESSED BY BLOOD BANK Performed at [facility], [place] [address], [place], [place] [address]   Vitamin B12     Status: None   Collection Time: 08/19/22 11:00 PM  Result Value Ref Range   Vitamin B-12 555 180 - 914 pg/mL    Comment: (NOTE) This assay is not validated for testing neonatal or myeloproliferative syndrome specimens for Vitamin B12 levels. Performed at [facility], [place] [address] [place], [place] [address]   Folate     Status: None   Collection Time: 08/19/22 11:00 PM  Result Value Ref Range   Folate 22.7 >5.9 ng/mL  Comment:  RESULT CONFIRMED BY MANUAL DILUTION Performed at [facility], [place] [address], [place], [place] [address]   Iron and TIBC     Status: Abnormal   Collection Time: 08/19/22 11:00 PM  Result Value Ref Range   Iron 17 (L) 45 - 182 ug/dL   TIBC 535 (H) 250 - 450 ug/dL   Saturation Ratios 3 (L) 17.9 - 39.5 %   UIBC 518 ug/dL    Comment: Performed at [facility] [address] [place], [place] [address]  Ferritin     Status: Abnormal   Collection Time: 08/19/22 11:00 PM  Result Value Ref Range   Ferritin 1 (L) 24 - 336 ng/mL    Comment: Performed at [facility], [place] [address], [place], [place] [address]  Reticulocytes     Status: Abnormal   Collection Time: 08/19/22 11:00 PM  Result Value Ref Range   Retic Ct Pct 0.7 0.4 - 3.1 %   RBC. 3.77 (L) 4.22 - 5.81 MIL/uL   Retic Count, Absolute 26.1 19.0 - 186.0 K/uL   Immature Retic Fract 17.9 (H) 2.3 - 15.9 %    Comment: Performed at [facility] [address], [place], [place] [address]  Hemoglobin and hematocrit, blood     Status: Abnormal   Collection Time: 08/20/22  1:30 AM  Result Value Ref Range   Hemoglobin 6.6 (LL) 13.0 - 17.0 g/dL    Comment: CRITICAL VALUE NOTED.  VALUE IS CONSISTENT WITH PREVIOUSLY REPORTED AND CALLED VALUE. REPEATED TO VERIFY    HCT 24.2 (L) 39.0 - 52.0 %    Comment: Performed at [facility], [place] [address], [place],  [address]  Prepare RBC (crossmatch)     Status: None   Collection Time: 08/20/22  5:30 AM  Result Value Ref Range   Order Confirmation      ORDER PROCESSED BY BLOOD BANK Performed at [facility], [place] [address] [place] [address]   CBC     Status: Abnormal   Collection Time: 08/20/22  6:19 AM  Result Value Ref Range   WBC 5.8 4.0 - 10.5 K/uL   RBC 3.94 (L) 4.22 - 5.81 MIL/uL   Hemoglobin 6.5 (LL) 13.0 - 17.0 g/dL    Comment: CRITICAL VALUE NOTED.  VALUE IS CONSISTENT WITH PREVIOUSLY REPORTED AND CALLED VALUE. REPEATED TO  VERIFY Reticulocyte Hemoglobin testing may be clinically indicated, consider ordering this additional test [id number]    HCT 24.7 (L) 39.0 - 52.0 %   MCV 62.7 (L) 80.0 - 100.0 fL   MCH 16.5 (L) 26.0 - 34.0 pg   MCHC 26.3 (L) 30.0 - 36.0 g/dL   RDW 24.3 (H) 11.5 - 15.5 %   Platelets 268 150 - 400 K/uL   nRBC 0.7 (H) 0.0 - 0.2 %    Comment: Performed at [facility] [address], [place],  [id number]  Basic metabolic panel     Status: Abnormal   Collection Time: 08/20/22  6:19 AM  Result Value Ref Range   Sodium 134 (L) 135 - 145 mmol/L   Potassium 3.8 3.5 - 5.1 mmol/L   Chloride 100 98 - 111 mmol/L   CO2 22 22 - 32 mmol/L   Glucose, Bld 97 70 - 99 mg/dL    Comment: Glucose reference range applies only to samples taken after fasting for at least 8 hours.   BUN 14 6 - 20 mg/dL  Creatinine, Ser 0.73 0.61 - 1.24 mg/dL   Calcium 8.7 (L) 8.9 - 10.3 mg/dL   GFR, Estimated >60 >60 mL/min    Comment: (NOTE) Calculated using the CKD-EPI Creatinine Equation (2021)    Anion gap 12 5 - 15    Comment: Performed at [facility] [address] [place], [place] [address]   No results found.  Pending Labs Unresulted Labs (From admission, onward)     Start     Ordered   08/20/22 0457  HIV Antibody (routine testing w rflx)  (HIV Antibody (Routine testing w reflex) panel)  Once,   R        08/20/22 0457            Vitals/Pain Today's Vitals   08/20/22 0619 08/20/22 0635 08/20/22 0745 08/20/22 0844  BP: 111/71 123/70 116/77 118/76  Pulse: 80 82 74 83  Resp: 14 18 15 16  $ Temp: 98.5 F (36.9 C) 98.5 F (36.9 C)  98.3 F (36.8 C)  TempSrc: Oral Oral    SpO2: 100% 100% 100% 100%  Weight:      Height:      PainSc:        Isolation Precautions No active isolations  Medications Medications  acetaminophen (TYLENOL) tablet 650 mg (has no administration in time range)    Or  acetaminophen (TYLENOL) suppository 650 mg (has no administration in time range)   ondansetron (ZOFRAN) tablet 4 mg (has no administration in time range)    Or  ondansetron (ZOFRAN) injection 4 mg (has no administration in time range)  pantoprazole (PROTONIX) injection 40 mg (40 mg Intravenous Given 08/20/22 0934)  0.9 %  sodium chloride infusion (Manually program via Guardrails IV Fluids) (0 mLs Intravenous Stopped 08/20/22 0020)  0.9 %  sodium chloride infusion (Manually program via Guardrails IV Fluids) (0 mLs Intravenous Stopped 08/20/22 0842)    Mobility walks     Focused Assessments Cardiac Assessment Handoff:    No results found for: "CKTOTAL", "CKMB", "CKMBINDEX", "TROPONINI" No results found for: "DDIMER" Does the Patient currently have chest pain? No   , Pulmonary Assessment Handoff:  Lung sounds:   O2 Device: Room Air      R Recommendations: See Admitting Provider Note  Report given to:   Additional Notes:

## 2022-08-20 NOTE — H&P (View-Only) (Signed)
Referring Provider: Dr. Jennette Kettle Primary Care Physician:  Patient, No Pcp Per Primary Gastroenterologist:  Dr. Ardis Hughs  Reason for Consultation: Hematochezia, anemia  HPI: Troy Stevenson is a 57 y.o. male with a past medical history of anxiety, GERD and internal hemorrhoids.  He was seen by his PCP due to having generalized weakness and fatigue.  Laboratory studies were done which showed profound anemia and he was sent to the ED for further evaluation. Labs in the ED showed a WBC count of 7.8.  Hemoglobin 5.8 (Hg 9.7 08/20/2018).  BUN 6. Creatinine 0.92. He was transfused with 1 unit of PRBCs -> posttransfusion hemoglobin 6.6.  A second unit of PRBCs ordered, not yet transfused.  He has a history of rectal bleeding and underwent a colonoscopy by Dr. Ardis Hughs 08/26/2018 which identified two 2 to 5 mm polyps removed from the transverse colon and a tubular friable 7-8 mm long nodule in the proximal anus which was thought to be the source of his rectal bleeding and anemia.  Left colon diverticulosis was also noted.  Path report showed the polyps were inflammatory polyps in the distal rectal nodule biopsies were consistent with polypoid granulation tissue.  Following this colonoscopy, he continued to have intermittent rectal bleeding which eventually abated.  In December 2023, he passed a small amount of bright red blood with each bowel movement for 1 month then stopped 1 month ago without recurrence.  He started feeling significantly fatigued and tired with a decreased appetite and 13 pound weight loss therefore he was seen by his PCP as noted above.  He denies having any abdominal or rectal pain.  He takes omeprazole 20 mg daily for 3 years for history of GERD.  He has pill dysphagia but no difficulty swallowing solid foods or liquids.  He denies ever having an EGD.  Rare NSAID use.  He drinks 3-4 beers daily.  No drug use.  No family history of colorectal cancer.  PAST GI PROCEDURES:  Colonoscopy  08/26/2018 by Dr. Ardis Hughs: Two 2 to 5 mm polyps in the transverse colon removed with a cold snare and resected Tubular friable 7-8 mm long nodule in the proximal anus just proximal to small internal hemorrhoids Left-sided diverticulosis Recall colonoscopy 10 years 1. Surgical [P], descending, polyp (2) - INFLAMMATORY POLYP (S). - NO ADENOMATOUS CHANGE OR MALIGNANCY. 2. Surgical [P], distal rectal nodule, polyp - POLYPOID, MILDLY INFLAMMED GRANULATION TISSUE. - NO ADENOMATOUS CHANGE OR MALIGNANCY  Past Medical History:  Diagnosis Date   Anxiety    Hepatitis     History reviewed. No pertinent surgical history.  Prior to Admission medications   Medication Sig Start Date End Date Taking? Authorizing Provider  Omeprazole 20 MG TBEC Take 20 capsules by mouth daily.   Yes [provider]    Current Facility-Administered Medications  Medication Dose Route Frequency Provider Last Rate Last Admin   0.9 %  sodium chloride infusion (Manually program via Guardrails IV Fluids)   Intravenous Once Troy Quill, DO       acetaminophen (TYLENOL) tablet 650 mg  650 mg Oral Q6H PRN Troy Quill, DO       Or   acetaminophen (TYLENOL) suppository 650 mg  650 mg Rectal Q6H PRN Troy Quill, DO       ondansetron [facility]) tablet 4 mg  4 mg Oral Q6H PRN Troy Quill, DO       Or   ondansetron [facility]) injection 4 mg  4 mg Intravenous  Q6H PRN Troy Quill, DO       Current Outpatient Medications  Medication Sig Dispense Refill   Omeprazole 20 MG TBEC Take 20 capsules by mouth daily.      Allergies as of 08/19/2022   (No Known Allergies)    Family History  Problem Relation Age of Onset   Hypertension Father    Diabetes Father    Colon cancer Neg Hx    Rectal cancer Neg Hx     Social History   Socioeconomic History   Marital status: Divorced    Spouse name: Not on file   Number of children: 1   Years of education: Not on file   Highest education level: Not on  file  Occupational History   Not on file  Tobacco Use   Smoking status: Never   Smokeless tobacco: Never  Substance and Sexual Activity   Alcohol use: Yes    Comment: 4-5 beers daily   Drug use: No   Sexual activity: Not on file  Other Topics Concern   Not on file  Social History Narrative   Not on file   Social Determinants of Health   Financial Resource Strain: Not on file  Food Insecurity: Not on file  Transportation Needs: Not on file  Physical Activity: Not on file  Stress: Not on file  Social Connections: Not on file  Intimate Partner Violence: Not on file    Review of Systems: Gen: + Weight loss. CV: Denies chest pain, palpitations or edema. Resp: Denies cough, shortness of breath of hemoptysis.  GI: See HPI. GU : Denies urinary burning, blood in urine, increased urinary frequency or incontinence. MS: Denies joint pain, muscles aches or weakness. Derm: Denies rash, itchiness, skin lesions or unhealing ulcers. Psych: Denies depression, anxiety, memory loss or confusion. Heme: Denies easy bruising, bleeding. Neuro:  Denies headaches, dizziness or paresthesias. Endo:  Denies any problems with DM, thyroid or adrenal function.  Physical Exam: Vital signs in last 24 hours: Temp:  [98.5 F (36.9 C)-98.8 F (37.1 C)] 98.5 F (36.9 C) (02/15 0635) Pulse Rate:  [72-111] 82 (02/15 0635) Resp:  [13-26] 18 (02/15 0635) BP: (111-139)/(68-79) 123/70 (02/15 0635) SpO2:  [99 %-100 %] 100 % (02/15 [id number]) Weight:  [75.8 kg] 75.8 kg (02/14 2155)   General:  Alert 57 year old male in no acute distress. Head:  Normocephalic and atraumatic. Eyes:  No scleral icterus. Conjunctiva pink. Ears:  Normal auditory acuity. Nose:  No deformity, discharge or lesions. Mouth:  Dentition intact. No ulcers or lesions.  Neck:  Supple. No lymphadenopathy or thyromegaly.  Lungs: Breath sounds clear throughout. No wheezes, rhonchi or crackles.  Heart: Rate and rhythm, no murmurs. Abdomen:,  Nondistended.  Nontender.  Positive bowel sounds all 4 quadrants. Rectal: Deferred. Musculoskeletal:  Symmetrical without gross deformities.  Pulses:  Normal pulses noted. Extremities:  Without clubbing or edema. Neurologic:  Alert and  oriented x 4. No focal deficits.  Skin:  Intact without significant lesions or rashes. Psych:  Alert and cooperative. Normal mood and affect.  Intake/Output from previous day: 02/14 0701 - 02/15 0700 In: 180 [I.V.:10; Blood:170] Out: -  Intake/Output this shift: No intake/output data recorded.  Lab Results: Recent Labs    08/19/22 1944 08/20/22 0130 08/20/22 0619  WBC 7.6  --  5.8  HGB 5.8* 6.6* 6.5*  HCT 23.3* 24.2* 24.7*  PLT 309  --  268   BMET Recent Labs    08/19/22 1944 08/20/22 0619  NA 134*  134*  K 3.8 3.8  CL 100 100  CO2 24 22  GLUCOSE 128* 97  BUN 6 14  CREATININE 0.92 0.73  CALCIUM 8.9 8.7*   LFT Recent Labs    08/19/22 1944  PROT 7.3  ALBUMIN 3.9  AST 19  ALT 12  ALKPHOS 88  BILITOT 0.5   PT/INR Recent Labs    08/19/22 1944  LABPROT 14.1  INR 1.1   Hepatitis Panel No results for input(s): "HEPBSAG", "HCVAB", "HEPAIGM", "HEPBIGM" in the last 72 hours.    Studies/Results: No results found.  IMPRESSION/PLAN:  57 year old male with a history of hematochezia (had active hematochezia Dec 2023 x 1 month, no obvious bleeding since then who was admitted to the hospital 08/19/2022 with symptomatic anemia.  Hemoglobin 5.8 transfused 1 unit of PRBCs -> Hg 6.6 -> 2nd unit of PRBCs ordered. Colonoscopy 08/2018 done for rectal bleeding showed two inflammatory polyps and an anal nodule, path showed polypoid/inflammed granulation tissue thought to be the source of bleeding at that time. Diverticulosis also noted. -Check H/H post transfusion then Q 6 hours x 24 hours -Transfuse for hemoglobin less than 7 or as needed if symptomatic -Colonoscopy benefits and risks discussed including risk with sedation, risk of bleeding,  perforation and infection, timing to be determined  -Clear liquid diet -Continue to monitor the patient for active GI bleeding  History of GERD, on Omeprazole 20 mg daily x 3 years. -Continue PPI IV twice daily -Consider EGD at time of colonoscopy, await further recommendations per Dr. Candis Stevenson  Unintentional weight loss, (15 lbs over the past 1 to 2 months) -Endoscopic evaluation as noted above -Consider CTAP imaging during this hospitalization   Troy Stevenson  08/20/2022, 10:04 AM  I have taken a history, reviewed the chart and examined the patient. I performed a substantive portion of this encounter, including complete performance of at least one of the key components, in conjunction with the APP. I agree with the APP's note, impression and recommendations  57 year old male with profound iron deficiency anemia with intermittent hematochezia but otherwise no GI symptoms and 13 lb unintentional weight loss.  He had a colonoscopy in 2020 to evaluate rectal bleeding and anemia (hgb 9) which showed a few benign inflammatory polyps. No family history of GI malignancy. Although colon seems unlikely source of anemia given the findings 3 years ago, I think it should be repeated given the hematochezia.  Will also perform upper endoscopy with gastric and duodenal biopsies.  Iron deficiency anemia - EGD, colonoscopy tomorrow  Troy Merlin E. Candis Schatz, MD [facility]

## 2022-08-20 NOTE — Progress Notes (Signed)
Pt arrived to 6N18 from the ED with anemia, received 2 units of PRBC in ED. Pt is alert and oriented, family at bedside. No dizziness reported at present.

## 2022-08-20 NOTE — H&P (Signed)
History and Physical    Patient: Troy Stevenson [id number] DOB: 01/15/1966 DOA: 08/19/2022 DOS: the patient was seen and examined on 08/20/2022 PCP: Patient, No Pcp Per  Patient coming from: Home  Chief Complaint:  Chief Complaint  Patient presents with   abnormal  labs   HPI: JADEL Stevenson is a 57 y.o. male with medical history significant of bleeding colon polyps and internal hemorrhoids in 2020.  Pt with h/o bleeding polyp in 2020, 2 polyps removed, also internal hemorrhoids, next repeat colonoscopy was going to be either 7 or 10 years depending on wether pathology was precancerous or not (looks like it was non-precancerous from what I can tell).  Pt with intermittent rectal bleeding when he wipes for the past 1-2 weeks.  Worsening fatigue in that time frame.  Does drink 3-4 beers daily.  Appetite loss and wt loss of 13 lbs over past 2 months.  No N/V prior transfusion.    Review of Systems: As mentioned in the history of present illness. All other systems reviewed and are negative. Past Medical History:  Diagnosis Date   Anxiety    Hepatitis    History reviewed. No pertinent surgical history. Social History:  reports that he has never smoked. He has never used smokeless tobacco. He reports current alcohol use. He reports that he does not use drugs.  No Known Allergies  Family History  Problem Relation Age of Onset   Hypertension Father    Diabetes Father    Colon cancer Neg Hx    Rectal cancer Neg Hx     Prior to Admission medications   Medication Sig Start Date End Date Taking? Authorizing Provider  Omeprazole 20 MG TBEC Take 20 capsules by mouth daily.   Yes [provider]    Physical Exam: Vitals:   08/19/22 2155 08/19/22 2215 08/19/22 2249 08/19/22 2306  BP:  138/79 126/68 129/76  Pulse:  (!) 111 (!) 104 97  Resp:  (!) 21 18 15  $ Temp:   98.7 F (37.1 C) 98.6 F (37 C)  TempSrc:   Oral Oral  SpO2:  100% 100% 100%  Weight: 75.8 kg      Height: 5' 5"$  (1.651 m)      Constitutional: NAD, calm, comfortable Respiratory: clear to auscultation bilaterally, no wheezing, no crackles. Normal respiratory effort. No accessory muscle use.  Cardiovascular: Regular rate and rhythm, no murmurs / rubs / gallops. No extremity edema. 2+ pedal pulses. No carotid bruits.  Abdomen: no tenderness, no masses palpated. No hepatosplenomegaly. Bowel sounds positive.  Neurologic: CN 2-12 grossly intact. Sensation intact, DTR normal. Strength 5/5 in all 4.  Psychiatric: Normal judgment and insight. Alert and oriented x 3. Normal mood.   Data Reviewed:       Latest Ref Rng & Units 08/20/2022    1:30 AM 08/19/2022    7:44 PM 08/20/2018    3:40 PM  CBC  WBC 4.0 - 10.5 K/uL  7.6  4.9   Hemoglobin 13.0 - 17.0 g/dL 6.6  5.8  9.7   Hematocrit 39.0 - 52.0 % 24.2  23.3  32.6   Platelets 150 - 400 K/uL  309  337       Latest Ref Rng & Units 08/19/2022    7:44 PM 08/20/2018    3:40 PM 03/01/2017    5:23 PM  CMP  Glucose 70 - 99 mg/dL 128  103  102   BUN 6 - 20 mg/dL 6  8  10  Creatinine 0.61 - 1.24 mg/dL 0.92  0.81  1.05   Sodium 135 - 145 mmol/L 134  138  134   Potassium 3.5 - 5.1 mmol/L 3.8  3.8  3.9   Chloride 98 - 111 mmol/L 100  107  99   CO2 22 - 32 mmol/L 24  23  24   $ Calcium 8.9 - 10.3 mg/dL 8.9  9.2  9.0   Total Protein 6.5 - 8.1 g/dL 7.3  7.4  8.1   Total Bilirubin 0.3 - 1.2 mg/dL 0.5  0.5  0.9   Alkaline Phos 38 - 126 U/L 88  94  79   AST 15 - 41 U/L 19  23  22   $ ALT 0 - 44 U/L 12  22  30    $ Iron/TIBC/Ferritin/ %Sat    Component Value Date/Time   IRON 17 (L) 08/19/2022 2300   TIBC 535 (H) 08/19/2022 2300   FERRITIN 1 (L) 08/19/2022 2300   IRONPCTSAT 3 (L) 08/19/2022 2300     Assessment and Plan: * Symptomatic anemia Looks like iron deficiency anemia, suspect a more chronic presentation.  Probably chronic GIB. Known h/o internal hemorrhoids. Also h/o bleeding polyp in 2020 Got 1u PRBC transfusion in ED Ordering 2nd unit  of PRBC since HGB 6.6 post transfusion still. GI consulted and will see in AM Anemia pnl confirms iron deficiency  GI bleed Suspect a more chronic GIB, probably from lower source given the intermittent hematochezia he reports as well as the HPI as well. GI to see in AM      Advance Care Planning:   Code Status: Full Code  Consults: Dr. Loletha Carrow  Family Communication: No family in room  Severity of Illness: The appropriate patient status for this patient is OBSERVATION. Observation status is judged to be reasonable and necessary in order to provide the required intensity of service to ensure the patient's safety. The patient's presenting symptoms, physical exam findings, and initial radiographic and laboratory data in the context of their medical condition is felt to place them at decreased risk for further clinical deterioration. Furthermore, it is anticipated that the patient will be medically stable for discharge from the hospital within 2 midnights of admission.   Author: Etta Quill., DO 08/20/2022 12:14 AM  For on call review [email or web address].

## 2022-08-20 NOTE — Consult Note (Addendum)
Referring Provider: Dr. Jennette Kettle Primary Care Physician:  Patient, No Pcp Per Primary Gastroenterologist:  Dr. Ardis Hughs  Reason for Consultation: Hematochezia, anemia  HPI: Troy Stevenson is a 57 y.o. male with a past medical history of anxiety, GERD and internal hemorrhoids.  He was seen by his PCP due to having generalized weakness and fatigue.  Laboratory studies were done which showed profound anemia and he was sent to the ED for further evaluation. Labs in the ED showed a WBC count of 7.8.  Hemoglobin 5.8 (Hg 9.7 08/20/2018).  BUN 6. Creatinine 0.92. He was transfused with 1 unit of PRBCs -> posttransfusion hemoglobin 6.6.  A second unit of PRBCs ordered, not yet transfused.  He has a history of rectal bleeding and underwent a colonoscopy by Dr. Ardis Hughs 08/26/2018 which identified two 2 to 5 mm polyps removed from the transverse colon and a tubular friable 7-8 mm long nodule in the proximal anus which was thought to be the source of his rectal bleeding and anemia.  Left colon diverticulosis was also noted.  Path report showed the polyps were inflammatory polyps in the distal rectal nodule biopsies were consistent with polypoid granulation tissue.  Following this colonoscopy, he continued to have intermittent rectal bleeding which eventually abated.  In December 2023, he passed a small amount of bright red blood with each bowel movement for 1 month then stopped 1 month ago without recurrence.  He started feeling significantly fatigued and tired with a decreased appetite and 13 pound weight loss therefore he was seen by his PCP as noted above.  He denies having any abdominal or rectal pain.  He takes omeprazole 20 mg daily for 3 years for history of GERD.  He has pill dysphagia but no difficulty swallowing solid foods or liquids.  He denies ever having an EGD.  Rare NSAID use.  He drinks 3-4 beers daily.  No drug use.  No family history of colorectal cancer.  PAST GI PROCEDURES:  Colonoscopy  08/26/2018 by Dr. Ardis Hughs: Two 2 to 5 mm polyps in the transverse colon removed with a cold snare and resected Tubular friable 7-8 mm long nodule in the proximal anus just proximal to small internal hemorrhoids Left-sided diverticulosis Recall colonoscopy 10 years 1. Surgical [P], descending, polyp (2) - INFLAMMATORY POLYP (S). - NO ADENOMATOUS CHANGE OR MALIGNANCY. 2. Surgical [P], distal rectal nodule, polyp - POLYPOID, MILDLY INFLAMMED GRANULATION TISSUE. - NO ADENOMATOUS CHANGE OR MALIGNANCY  Past Medical History:  Diagnosis Date   Anxiety    Hepatitis     History reviewed. No pertinent surgical history.  Prior to Admission medications   Medication Sig Start Date End Date Taking? Authorizing Provider  Omeprazole 20 MG TBEC Take 20 capsules by mouth daily.   Yes [provider]    Current Facility-Administered Medications  Medication Dose Route Frequency Provider Last Rate Last Admin   0.9 %  sodium chloride infusion (Manually program via Guardrails IV Fluids)   Intravenous Once Etta Quill, DO       acetaminophen (TYLENOL) tablet 650 mg  650 mg Oral Q6H PRN Etta Quill, DO       Or   acetaminophen (TYLENOL) suppository 650 mg  650 mg Rectal Q6H PRN Etta Quill, DO       ondansetron [facility]) tablet 4 mg  4 mg Oral Q6H PRN Etta Quill, DO       Or   ondansetron [facility]) injection 4 mg  4 mg Intravenous  Q6H PRN Etta Quill, DO       Current Outpatient Medications  Medication Sig Dispense Refill   Omeprazole 20 MG TBEC Take 20 capsules by mouth daily.      Allergies as of 08/19/2022   (No Known Allergies)    Family History  Problem Relation Age of Onset   Hypertension Father    Diabetes Father    Colon cancer Neg Hx    Rectal cancer Neg Hx     Social History   Socioeconomic History   Marital status: Divorced    Spouse name: Not on file   Number of children: 1   Years of education: Not on file   Highest education level: Not on  file  Occupational History   Not on file  Tobacco Use   Smoking status: Never   Smokeless tobacco: Never  Substance and Sexual Activity   Alcohol use: Yes    Comment: 4-5 beers daily   Drug use: No   Sexual activity: Not on file  Other Topics Concern   Not on file  Social History Narrative   Not on file   Social Determinants of Health   Financial Resource Strain: Not on file  Food Insecurity: Not on file  Transportation Needs: Not on file  Physical Activity: Not on file  Stress: Not on file  Social Connections: Not on file  Intimate Partner Violence: Not on file    Review of Systems: Gen: + Weight loss. CV: Denies chest pain, palpitations or edema. Resp: Denies cough, shortness of breath of hemoptysis.  GI: See HPI. GU : Denies urinary burning, blood in urine, increased urinary frequency or incontinence. MS: Denies joint pain, muscles aches or weakness. Derm: Denies rash, itchiness, skin lesions or unhealing ulcers. Psych: Denies depression, anxiety, memory loss or confusion. Heme: Denies easy bruising, bleeding. Neuro:  Denies headaches, dizziness or paresthesias. Endo:  Denies any problems with DM, thyroid or adrenal function.  Physical Exam: Vital signs in last 24 hours: Temp:  [98.5 F (36.9 C)-98.8 F (37.1 C)] 98.5 F (36.9 C) (02/15 0635) Pulse Rate:  [72-111] 82 (02/15 0635) Resp:  [13-26] 18 (02/15 0635) BP: (111-139)/(68-79) 123/70 (02/15 0635) SpO2:  [99 %-100 %] 100 % (02/15 [id number]) Weight:  [75.8 kg] 75.8 kg (02/14 2155)   General:  Alert 57 year old male in no acute distress. Head:  Normocephalic and atraumatic. Eyes:  No scleral icterus. Conjunctiva pink. Ears:  Normal auditory acuity. Nose:  No deformity, discharge or lesions. Mouth:  Dentition intact. No ulcers or lesions.  Neck:  Supple. No lymphadenopathy or thyromegaly.  Lungs: Breath sounds clear throughout. No wheezes, rhonchi or crackles.  Heart: Rate and rhythm, no murmurs. Abdomen:,  Nondistended.  Nontender.  Positive bowel sounds all 4 quadrants. Rectal: Deferred. Musculoskeletal:  Symmetrical without gross deformities.  Pulses:  Normal pulses noted. Extremities:  Without clubbing or edema. Neurologic:  Alert and  oriented x 4. No focal deficits.  Skin:  Intact without significant lesions or rashes. Psych:  Alert and cooperative. Normal mood and affect.  Intake/Output from previous day: 02/14 0701 - 02/15 0700 In: 180 [I.V.:10; Blood:170] Out: -  Intake/Output this shift: No intake/output data recorded.  Lab Results: Recent Labs    08/19/22 1944 08/20/22 0130 08/20/22 0619  WBC 7.6  --  5.8  HGB 5.8* 6.6* 6.5*  HCT 23.3* 24.2* 24.7*  PLT 309  --  268   BMET Recent Labs    08/19/22 1944 08/20/22 0619  NA 134*  134*  K 3.8 3.8  CL 100 100  CO2 24 22  GLUCOSE 128* 97  BUN 6 14  CREATININE 0.92 0.73  CALCIUM 8.9 8.7*   LFT Recent Labs    08/19/22 1944  PROT 7.3  ALBUMIN 3.9  AST 19  ALT 12  ALKPHOS 88  BILITOT 0.5   PT/INR Recent Labs    08/19/22 1944  LABPROT 14.1  INR 1.1   Hepatitis Panel No results for input(s): "HEPBSAG", "HCVAB", "HEPAIGM", "HEPBIGM" in the last 72 hours.    Studies/Results: No results found.  IMPRESSION/PLAN:  57 year old male with a history of hematochezia (had active hematochezia Dec 2023 x 1 month, no obvious bleeding since then who was admitted to the hospital 08/19/2022 with symptomatic anemia.  Hemoglobin 5.8 transfused 1 unit of PRBCs -> Hg 6.6 -> 2nd unit of PRBCs ordered. Colonoscopy 08/2018 done for rectal bleeding showed two inflammatory polyps and an anal nodule, path showed polypoid/inflammed granulation tissue thought to be the source of bleeding at that time. Diverticulosis also noted. -Check H/H post transfusion then Q 6 hours x 24 hours -Transfuse for hemoglobin less than 7 or as needed if symptomatic -Colonoscopy benefits and risks discussed including risk with sedation, risk of bleeding,  perforation and infection, timing to be determined  -Clear liquid diet -Continue to monitor the patient for active GI bleeding  History of GERD, on Omeprazole 20 mg daily x 3 years. -Continue PPI IV twice daily -Consider EGD at time of colonoscopy, await further recommendations per Dr. Candis Schatz  Unintentional weight loss, (15 lbs over the past 1 to 2 months) -Endoscopic evaluation as noted above -Consider CTAP imaging during this hospitalization   Noralyn Pick  08/20/2022, 10:04 AM  I have taken a history, reviewed the chart and examined the patient. I performed a substantive portion of this encounter, including complete performance of at least one of the key components, in conjunction with the APP. I agree with the APP's note, impression and recommendations  57 year old male with profound iron deficiency anemia with intermittent hematochezia but otherwise no GI symptoms and 13 lb unintentional weight loss.  He had a colonoscopy in 2020 to evaluate rectal bleeding and anemia (hgb 9) which showed a few benign inflammatory polyps. No family history of GI malignancy. Although colon seems unlikely source of anemia given the findings 3 years ago, I think it should be repeated given the hematochezia.  Will also perform upper endoscopy with gastric and duodenal biopsies.  Iron deficiency anemia - EGD, colonoscopy tomorrow  Jaelie Aguilera E. Candis Schatz, MD [facility]

## 2022-08-20 NOTE — Progress Notes (Signed)
Patient seen and examined personally, I reviewed the chart, history and physical and admission note, done by admitting physician this morning and agree with the same with following addendum.  Please refer to the morning admission note for more detailed plan of care.  Briefly,   57 y.o. male with medical history significant of bleeding colon polyps and internal hemorrhoids in 2020, with h/o bleeding polyp in 2020, 2 polyps removed, also internal hemorrhoids, next repeat colonoscopy was going to be either 7 or 10 years depending on wether pathology was precancerous or not (looks like it was non-precancerous from what I can tell) presented w/ intermittent rectal bleeding when he wipes for the past 1-2 weeks.  Worsening fatigue in that time frame.  Does drink 3-4 beers daily.  Appetite loss and wt loss of 13 lbs over past 2 months.  No N/V prior transfusion. In the ED anemic hemoglobin 5.8  gm, previous hemoglobin 9.7 on admission 08/20/2018, GI was consulted, and admitted for further management   AP Symptomatic anemia History of GI bleeding-suspect chronic GI bleed gerd: Transfusing second unit PRBC in the ED, GI has been consulted.  Add PPI twice daily, continue n.p.o., monitor serial H&H.  Anemia panel with folate B12 stable ferritin 1 will benefit with IV iron before discharge.  Monitor H&H closely, endoscopic evaluation is being planned per GI Unintentional weight loss 15 pounds over the past 1 to 54-month See above.  If negative endoscopy evaluation manage CT scan

## 2022-08-20 NOTE — Assessment & Plan Note (Signed)
Suspect a more chronic GIB, probably from lower source given the intermittent hematochezia he reports as well as the HPI as well. GI to see in AM

## 2022-08-21 ENCOUNTER — Encounter (HOSPITAL_COMMUNITY)
Admission: EM | Disposition: A | Discharge status: Home / Self Care | Payer: Self-pay | Source: Home / Self Care | Attending: Internal Medicine

## 2022-08-21 ENCOUNTER — Encounter (HOSPITAL_COMMUNITY): Payer: Self-pay | Admitting: Internal Medicine

## 2022-08-21 DIAGNOSIS — K649 Unspecified hemorrhoids: Secondary | ICD-10-CM | POA: Diagnosis not present

## 2022-08-21 DIAGNOSIS — D509 Iron deficiency anemia, unspecified: Secondary | ICD-10-CM | POA: Diagnosis not present

## 2022-08-21 DIAGNOSIS — K317 Polyp of stomach and duodenum: Secondary | ICD-10-CM

## 2022-08-21 DIAGNOSIS — D649 Anemia, unspecified: Secondary | ICD-10-CM | POA: Diagnosis not present

## 2022-08-21 HISTORY — PX: POLYPECTOMY: SHX5525

## 2022-08-21 HISTORY — PX: COLONOSCOPY WITH PROPOFOL: SHX5780

## 2022-08-21 HISTORY — PX: BIOPSY: SHX5522

## 2022-08-21 HISTORY — PX: ESOPHAGOGASTRODUODENOSCOPY (EGD) WITH PROPOFOL: SHX5813

## 2022-08-21 LAB — CBC
HCT: 32.7 % — ABNORMAL LOW (ref 39.0–52.0)
Hemoglobin: 9.2 g/dL — ABNORMAL LOW (ref 13.0–17.0)
MCH: 18.1 pg — ABNORMAL LOW (ref 26.0–34.0)
MCHC: 28.1 g/dL — ABNORMAL LOW (ref 30.0–36.0)
MCV: 64.5 fL — ABNORMAL LOW (ref 80.0–100.0)
Platelets: 311 10*3/uL (ref 150–400)
RBC: 5.07 MIL/uL (ref 4.22–5.81)
RDW: 27.1 % — ABNORMAL HIGH (ref 11.5–15.5)
WBC: 5.5 10*3/uL (ref 4.0–10.5)
nRBC: 0 % (ref 0.0–0.2)

## 2022-08-21 LAB — BASIC METABOLIC PANEL
Anion gap: 10 (ref 5–15)
BUN: 13 mg/dL (ref 6–20)
CO2: 21 mmol/L — ABNORMAL LOW (ref 22–32)
Calcium: 9.5 mg/dL (ref 8.9–10.3)
Chloride: 107 mmol/L (ref 98–111)
Creatinine, Ser: 0.91 mg/dL (ref 0.61–1.24)
GFR, Estimated: >60 mL/min (ref >60)
Glucose, Bld: 126 mg/dL — ABNORMAL HIGH (ref 70–99)
Potassium: 4.6 mmol/L (ref 3.5–5.1)
Sodium: 138 mmol/L (ref 135–145)

## 2022-08-21 LAB — BPAM RBC
Blood Product Expiration Date: 202403152359
Blood Product Expiration Date: 202403152359
ISSUE DATE / TIME: 202402142242
ISSUE DATE / TIME: 202402150533
Unit Type and Rh: 5100
Unit Type and Rh: 5100

## 2022-08-21 LAB — TYPE AND SCREEN
Unit division: 0
Unit division: 0

## 2022-08-21 SURGERY — ESOPHAGOGASTRODUODENOSCOPY (EGD) WITH PROPOFOL
Anesthesia: Moderate Sedation

## 2022-08-21 MED ORDER — FENTANYL CITRATE (PF) 100 MCG/2ML IJ SOLN
INTRAMUSCULAR | Status: DC | PRN
  Administered 2022-08-21 (×7): 25 ug via INTRAVENOUS

## 2022-08-21 MED ORDER — FENTANYL CITRATE (PF) 100 MCG/2ML IJ SOLN
INTRAMUSCULAR | Status: AC
  Filled 2022-08-21: qty 4

## 2022-08-21 MED ORDER — MIDAZOLAM HCL (PF) 5 MG/ML IJ SOLN
INTRAMUSCULAR | Status: DC | PRN
  Administered 2022-08-21 (×4): 1 mg via INTRAVENOUS
  Administered 2022-08-21: 2 mg via INTRAVENOUS
  Administered 2022-08-21: 1 mg via INTRAVENOUS
  Administered 2022-08-21: 2 mg via INTRAVENOUS
  Administered 2022-08-21: 1 mg via INTRAVENOUS

## 2022-08-21 MED ORDER — SODIUM CHLORIDE 0.9 % IV SOLN
250.0000 mg | Freq: Once | INTRAVENOUS | Status: AC
  Administered 2022-08-21: 250 mg via INTRAVENOUS
  Filled 2022-08-21: qty 20

## 2022-08-21 MED ORDER — MIDAZOLAM HCL (PF) 5 MG/ML IJ SOLN
INTRAMUSCULAR | Status: AC
  Filled 2022-08-21: qty 2

## 2022-08-21 MED ORDER — OMEPRAZOLE 20 MG PO TBEC: 40.0000 mg | DELAYED_RELEASE_TABLET | Freq: Every day | ORAL | Status: DC

## 2022-08-21 MED ORDER — SODIUM CHLORIDE 0.9 % IV SOLN: INTRAVENOUS | Status: AC

## 2022-08-21 MED ORDER — DIPHENHYDRAMINE HCL 50 MG/ML IJ SOLN
INTRAMUSCULAR | Status: AC
  Filled 2022-08-21: qty 1

## 2022-08-21 SURGICAL SUPPLY — 25 items

## 2022-08-21 NOTE — Procedures (Signed)
disregard

## 2022-08-21 NOTE — Progress Notes (Cosign Needed)
[facility] Progress Note  CC:  Hematochezia, anemia   Subjective: He cleared after taking the bowel prep, had a little red blood on the toilet tissue and on the stool after numerous bowel movements. No Abdominal pain. No N/V. No CP or SOB. He would like to take a shower today. Wife at the bedside.   Objective:  Vital signs in last 24 hours: Temp:  [97.9 F (36.6 C)-98.2 F (36.8 C)] 98 F (36.7 C) (02/16 0732) Pulse Rate:  [71-87] 71 (02/16 0732) Resp:  [16-18] 16 (02/16 0732) BP: (97-126)/(61-78) 97/64 (02/16 0732) SpO2:  [99 %-100 %] 100 % (02/16 0732) Last BM Date : 08/21/22 General: 57 year old male in NAD. Heart: RRR, no murmur.  Pulm: Breath sounds clear throughout. Abdomen: Soft, nondistended. Nontender. + BS x 4 quads.  Extremities:  Without edema. Neurologic:  Alert and  oriented x 4. Grossly normal neurologically. Psych:  Alert and cooperative. Normal mood and affect.  Intake/Output from previous day: 02/15 0701 - 02/16 0700 In: 1828.8 [P.O.:1680; I.V.:148.8] Out: -  Intake/Output this shift: No intake/output data recorded.  Lab Results: Recent Labs    08/19/22 1944 08/20/22 0130 08/20/22 0619 08/20/22 1410 08/21/22 0416  WBC 7.6  --  5.8  --  5.5  HGB 5.8*   < > 6.5* 8.8* 9.2*  HCT 23.3*   < > 24.7* 30.6* 32.7*  PLT 309  --  268  --  311   < > = values in this interval not displayed.   BMET Recent Labs    08/19/22 1944 08/20/22 0619 08/21/22 0416  NA 134* 134* 138  K 3.8 3.8 4.6  CL 100 100 107  CO2 24 22 21*  GLUCOSE 128* 97 126*  BUN 6 14 13  $ CREATININE 0.92 0.73 0.91  CALCIUM 8.9 8.7* 9.5   LFT Recent Labs    08/19/22 1944  PROT 7.3  ALBUMIN 3.9  AST 19  ALT 12  ALKPHOS 88  BILITOT 0.5   PT/INR Recent Labs    08/19/22 1944  LABPROT 14.1  INR 1.1   Hepatitis Panel No results for input(s): "HEPBSAG", "HCVAB", "HEPAIGM", "HEPBIGM" in the last 72 hours.  No results found.  Assessment /  Plan:  57 year old male with a history of hematochezia (had active hematochezia Dec 2023 x 1 month, no obvious bleeding since then who was admitted to the hospital 08/19/2022 with symptomatic anemia.  Hemoglobin 5.8 transfused 1 unit of PRBCs -> Hg 6.6 -> 2nd unit of PRBCs -> Hg 8.8 -> 9.2. Colonoscopy 08/2018 done for rectal bleeding showed two inflammatory polyps and an anal nodule, path showed polypoid/inflammed granulation tissue thought to be the source of bleeding at that time. Diverticulosis also noted. -Planning on EGD and colonoscopy today, awaiting anesthesia availability  -NPO -NS IV @ 75 cc/hr -Transfuse for hemoglobin less than 7 or as needed if symptomatic -Continue to monitor the patient for active GI bleeding   History of GERD, on Omeprazole 20 mg daily x 3 years. -Continue PPI IV twice daily -EGD as noted above    Unintentional weight loss, (15 lbs over the past 1 to 2 months) -Endoscopic evaluation as noted above -Consider CTAP imaging during this hospitalization    Principal Problem:   Symptomatic anemia Active Problems:   GI bleed     LOS: 0 days   Noralyn Pick  08/21/2022, 1:16 PM  I have taken a history, reviewed the chart and examined the patient. I  performed a substantive portion of this encounter, including complete performance of at least one of the key components, in conjunction with the APP. I agree with the APP's note, impression and recommendations  Please see procedure notes for findings.  No obvious etiologies for iron deficiency anemia.  Will plan for eventual capsule endoscopy, timing to be determined  Coburn Knaus E. Candis Schatz, MD [facility]

## 2022-08-21 NOTE — Op Note (Signed)
[facility] Patient Name: Troy Stevenson Procedure Date : 08/21/2022 MRN: [id number] Attending MD: Gladstone Pih. Candis Schatz , MD, [id number] Date of Birth: Dec 23, 1965 CSN: [id number] Age: 57 Admit Type: Inpatient Procedure:                Colonoscopy Indications:              Unexplained iron deficiency anemia Providers:                Nicki Reaper E. Candis Schatz, MD, Jeanella Cara, RN,                            Dulcy Fanny, Cherylynn Ridges, Technician Referring MD:              Medicines:                Midazolam 7 mg IV, Fentanyl 125 micrograms IV Complications:            No immediate complications. Estimated Blood Loss:     Estimated blood loss: none. Procedure:                Pre-Anesthesia Assessment:                           - Prior to the procedure, a History and Physical                            was performed, and patient medications and                            allergies were reviewed. The patient's tolerance of                            previous anesthesia was also reviewed. The risks                            and benefits of the procedure and the sedation                            options and risks were discussed with the patient.                            All questions were answered, and informed consent                            was obtained. Prior Anticoagulants: The patient has                            taken no anticoagulant or antiplatelet agents. ASA                            Grade Assessment: II - A patient with mild systemic                            disease. After reviewing the risks and benefits,  the patient was deemed in satisfactory condition to                            undergo the procedure.                           - Prior to the procedure, a History and Physical                            was performed, and patient medications and                            allergies were reviewed. The patient's  tolerance of                            previous anesthesia was also reviewed. The risks                            and benefits of the procedure and the sedation                            options and risks were discussed with the patient.                            All questions were answered, and informed consent                            was obtained. Prior Anticoagulants: The patient has                            taken no anticoagulant or antiplatelet agents. ASA                            Grade Assessment: II - A patient with mild systemic                            disease. After reviewing the risks and benefits,                            the patient was deemed in satisfactory condition to                            undergo the procedure.                           After obtaining informed consent, the colonoscope                            was passed under direct vision. Throughout the                            procedure, the patient's blood pressure, pulse, and  oxygen saturations were monitored continuously. The                            [id number] Olympus colonoscope was                            introduced through the anus and advanced to the the                            terminal ileum, with identification of the                            appendiceal orifice and IC valve. The colonoscopy                            was performed without difficulty. The patient                            tolerated the procedure well. The quality of the                            bowel preparation was excellent. The terminal                            ileum, ileocecal valve, appendiceal orifice, and                            rectum were photographed. Scope In: 3:23:46 PM Scope Out: 3:43:35 PM Scope Withdrawal Time: 0 hours 10 minutes 57 seconds  Total Procedure Duration: 0 hours 19 minutes 49 seconds  Findings:      The perianal and digital rectal  examinations were normal. Pertinent       negatives include normal sphincter tone and no palpable rectal lesions.      A few small-mouthed diverticula were found in the sigmoid colon and       descending colon.      The exam was otherwise normal throughout the examined colon.      The terminal ileum appeared normal.      Non-bleeding internal hemorrhoids were found during retroflexion. The       hemorrhoids were Grade I (internal hemorrhoids that do not prolapse).      No additional abnormalities were found on retroflexion. Impression:               - Diverticulosis in the sigmoid colon and in the                            descending colon.                           - The examined portion of the ileum was normal.                           - Non-bleeding internal hemorrhoids. This is the  source of the patient's intermittent hematochezia,                            but this would be unlikely to explain severe iron                            deficiency anemia.                           - No specimens collected. Moderate Sedation:      Moderate (conscious) sedation was administered by the nurse and       supervised by the endoscopist. The following parameters were monitored:       oxygen saturation, heart rate, blood pressure, and response to care.       Total physician intraservice time was 61 minutes (for both EGD and       colonoscopy). Recommendation:           - Return patient to hospital ward for ongoing care.                           - Resume regular diet.                           - Continue present medications.                           - Repeat colonoscopy in 10 years for screening                            purposes.                           - Recommend video capsule endoscopy for further                            evaluation of IDA. Procedure Code(s):        --- Professional ---                           [id number], Colonoscopy, flexible; diagnostic,  including                            collection of specimen(s) by brushing or washing,                            when performed (separate procedure) Diagnosis Code(s):        --- Professional ---                           K64.0, First degree hemorrhoids                           D50.9, Iron deficiency anemia, unspecified                           K57.30, Diverticulosis of large intestine without  perforation or abscess without bleeding CPT copyright 2022 American [employer] Association. All rights reserved. The codes documented in this report are preliminary and upon coder review may  be revised to meet current compliance requirements. Avalie Oconnor E. Candis Schatz, MD 08/21/2022 4:49:01 PM This report has been signed electronically. Number of Addenda: 0

## 2022-08-21 NOTE — Op Note (Signed)
[facility] Patient Name: Troy Stevenson Procedure Date : 08/21/2022 MRN: [id number] Attending MD: Gladstone Pih. Candis Schatz , MD, [id number] Date of Birth: [birth year] CSN: [id number] Age: 57 Admit Type: Inpatient Procedure:                Upper GI endoscopy Indications:              Suspected gastrointestinal bleeding in patient with                            unexplained iron deficiency anemia Providers:                Nicki Reaper E. Candis Schatz, MD, Jeanella Cara, RN,                            Cherylynn Ridges, Technician Referring MD:              Medicines:                Midazolam 3 mg IV, Fentanyl 50 micrograms IV Complications:            No immediate complications. Estimated Blood Loss:     Estimated blood loss was minimal. Procedure:                Pre-Anesthesia Assessment:                           - Prior to the procedure, a History and Physical                            was performed, and patient medications and                            allergies were reviewed. The patient's tolerance of                            previous anesthesia was also reviewed. The risks                            and benefits of the procedure and the sedation                            options and risks were discussed with the patient.                            All questions were answered, and informed consent                            was obtained. Prior Anticoagulants: The patient has                            taken no anticoagulant or antiplatelet agents. ASA                            Grade Assessment: II - A patient with mild systemic  disease. After reviewing the risks and benefits,                            the patient was deemed in satisfactory condition to                            undergo the procedure.                           After obtaining informed consent, the endoscope was                            passed under direct vision.  Throughout the                            procedure, the patient's blood pressure, pulse, and                            oxygen saturations were monitored continuously. The                            [id number] Olympus endoscope was introduced                            through the mouth, and advanced to the third part                            of duodenum. The upper GI endoscopy was                            accomplished without difficulty. The patient                            tolerated the procedure fairly well. Scope In: Scope Out: Findings:      The examined esophagus was normal.      Innumerable 3 to 15 mm semi-pedunculated polyps with no bleeding and no       stigmata of recent bleeding were found in the gastric fundus and in the       gastric body. Eight of the larger polyps were removed with a hot snare.       A [name] net was used to retrieve the polyps. It is unlikely all of the       polyps were retrieved. The patient was retching with reinsertion of the       endoscope for successive retrievals of the polyps. Estimated blood loss       was minimal.      Normal mucosa was found in the entire examined stomach. Biopsies were       taken with a cold forceps for Helicobacter pylori testing. Estimated       blood loss was minimal.      The examined duodenum was normal. Biopsies for histology were taken with       a cold forceps for evaluation of celiac disease. Estimated blood loss       was minimal. Impression:               -  Normal esophagus.                           - Multiple gastric polyps. Resected and partially                            retrieved. These appeared consistent with fundic                            gland polyps. The patient takes omeprazole daily.                           - Normal mucosa was found in the entire stomach.                            Biopsied.                           - Normal examined duodenum. Biopsied.                            - It is doubtful (but possible) that these polyps                            explain his iron deficiency anemia Moderate Sedation:      Moderate (conscious) sedation was administered by the nurse and       supervised by the endoscopist. The following parameters were monitored:       oxygen saturation, heart rate, blood pressure, respiratory rate, EKG,       adequacy of pulmonary ventilation, and response to care. Total physician       intraservice time was 61 minutes. Recommendation:           - Return patient to hospital ward for ongoing care.                           - Resume regular diet.                           - Continue present medications.                           - Await pathology results.                           - Repeat upper endoscopy (date not yet determined)                            for surveillance based on pathology results.                           - Recommend capsule endoscopy for further                            evaluation of severe iron deficiency anemia. This  can be done as an outpatient given the absence of                            active GI bleeding. Procedure Code(s):        --- Professional ---                           [id number], Esophagogastroduodenoscopy, flexible,                            transoral; with removal of tumor(s), polyp(s), or                            other lesion(s) by snare technique Diagnosis Code(s):        --- Professional ---                           K31.7, Polyp of stomach and duodenum                           D50.9, Iron deficiency anemia, unspecified CPT copyright 2022 American Medical Association. All rights reserved. The codes documented in this report are preliminary and upon coder review may  be revised to meet current compliance requirements. Hervey Wedig E. Candis Schatz, MD 08/21/2022 4:43:54 PM This report has been signed electronically. Number of Addenda: 0

## 2022-08-21 NOTE — Progress Notes (Signed)
PROGRESS NOTE Troy Stevenson  [id number] DOB: [birth year] DOA: 08/19/2022 PCP: Patient, No Pcp Per   Brief Narrative/Hospital Course:  57 y.o. male with medical history significant of bleeding colon polyps and internal hemorrhoids in 2020, with h/o bleeding polyp in 2020, 2 polyps removed, also internal hemorrhoids, next repeat colonoscopy was going to be either 7 or 10 years depending on wether pathology was precancerous or not (looks like it was non-precancerous from what I can tell) presented w/ intermittent rectal bleeding when he wipes for the past 1-2 weeks.  Worsening fatigue in that time frame.  Does drink 3-4 beers daily.  Appetite loss and wt loss of 13 lbs over past 2 months.  No N/V prior transfusion. In the ED anemic hemoglobin 5.8  gm, previous hemoglobin 9.7 on admission 08/20/2018, GI was consulted, and admitted for further management. SEEN by GI- planning for colo and EGD. S/p 2 units prbc and HB has improved. Found to have severe anemia- iron infusion ordered as well.    Subjective: Seen this am feels better. Had blood in paper after finishing prep overnight prep was clear he says   Assessment and Plan: Principal Problem:   Symptomatic anemia Active Problems:   GI bleed   Symptomatic anemia Iron deficiency anemia History of GI bleeding-suspect chronic GI bleed gerd: Transfused 2 units PRBC> hh stable. Cont PPI.GI was consulted, planning for endoscopic evaluation, hemoglobin has stabilized since transfusion, will do iron infusion for severe iron deficiency.  Unintentional weight loss 15 pounds over the past 1 to 11-month See above.  plan as per GI  DVT prophylaxis: SCDs Start: 08/20/22 0457 Code Status:   Code Status: Full Code Family Communication: plan of care discussed with patient/ at bedside. Patient status is: admitted as observation but remains hospitalized for ongoing  because of GI bleeding Level of care: Telemetry Medical   Dispo: The patient is from:  home            Anticipated disposition: home  pending egd/colo results  Objective: Vitals last 24 hrs: Vitals:   08/20/22 1935 08/20/22 2356 08/21/22 0357 08/21/22 0732  BP: 119/61 126/75 123/78 97/64  Pulse: 84 87 74 71  Resp: 18 18 18 16  $ Temp: 98.1 F (36.7 C) 98.2 F (36.8 C) 97.9 F (36.6 C) 98 F (36.7 C)  TempSrc: Oral Oral Oral Oral  SpO2: 100% 100% 99% 100%  Weight:      Height:       Weight change:   Physical Examination: General exam: alert awake, older than stated age HEENT:Oral mucosa moist, Ear/Nose WNL grossly Respiratory system: bilaterally clear BS, no use of accessory muscle Cardiovascular system: S1 & S2 +, No JVD. Gastrointestinal system: Abdomen soft,NT,ND, BS+ Nervous System:Alert, awake, moving extremities. Extremities: LE edema neg,distal peripheral pulses palpable.  Skin: No rashes,no icterus. MSK: Normal muscle bulk,tone, power  Medications reviewed:  Scheduled Meds:  pantoprazole (PROTONIX) IV  40 mg Intravenous Q12H   Continuous Infusions:  sodium chloride 20 mL/hr at 08/21/22 0419   ferric gluconate (FERRLECIT) IVPB 250 mg (08/21/22 1020)     Diet Order             Diet NPO time specified  Diet effective midnight                   Intake/Output Summary (Last 24 hours) at 08/21/2022 1121 Last data filed at 08/21/2022 0419 Gross per 24 hour  Intake 1828.84 ml  Output --  Net 1828.84 ml  Net IO Since Admission: 2,008.84 mL [08/21/22 1121]  Wt Readings from Last 3 Encounters:  08/19/22 75.8 kg  08/26/18 81.2 kg  08/24/18 80.7 kg     Unresulted Labs (From admission, onward)     Start     Ordered   08/21/22 [id number]  Basic metabolic panel  Daily,   R      08/20/22 1424   08/21/22 0500  CBC  Daily,   R      08/20/22 1424          Data Reviewed: I have personally reviewed following labs and imaging studies CBC: Recent Labs  Lab 08/19/22 1944 08/20/22 0130 08/20/22 0619 08/20/22 1410 08/21/22 0416  WBC 7.6  --   5.8  --  5.5  NEUTROABS 5.6  --   --   --   --   HGB 5.8* 6.6* 6.5* 8.8* 9.2*  HCT 23.3* 24.2* 24.7* 30.6* 32.7*  MCV 60.4*  --  62.7*  --  64.5*  PLT 309  --  268  --  [id number]   Basic Metabolic Panel: Recent Labs  Lab 08/19/22 1944 08/20/22 0619 08/21/22 0416  NA 134* 134* 138  K 3.8 3.8 4.6  CL 100 100 107  CO2 24 22 21*  GLUCOSE 128* 97 126*  BUN 6 14 13  $ CREATININE 0.92 0.73 0.91  CALCIUM 8.9 8.7* 9.5   GFR: Estimated Creatinine Clearance: 87.2 mL/min (by C-G formula based on SCr of 0.91 mg/dL). Liver Function Tests: Recent Labs  Lab 08/19/22 1944  AST 19  ALT 12  ALKPHOS 88  BILITOT 0.5  PROT 7.3  ALBUMIN 3.9   No results for input(s): "LIPASE", "AMYLASE" in the last 168 hours. No results for input(s): "AMMONIA" in the last 168 hours. Coagulation Profile: Recent Labs  Lab 08/19/22 1944  INR 1.1    No results found for this or any previous visit (from the past 240 hour(s)).  Antimicrobials: Anti-infectives (From admission, onward)    None      Culture/Microbiology    Component Value Date/Time   SDES BLOOD LEFT ANTECUBITAL 03/01/2017 1729   SPECREQUEST  03/01/2017 1729    BOTTLES DRAWN AEROBIC AND ANAEROBIC Blood Culture adequate volume   CULT NO GROWTH 5 DAYS 03/01/2017 1729   REPTSTATUS 03/06/2017 FINAL 03/01/2017 1729  Radiology Studies: No results found.   LOS: 0 days   Antonieta Pert, MD [facility]  08/21/2022, 11:21 AM

## 2022-08-21 NOTE — Interval H&P Note (Signed)
History and Physical Interval Note:  08/21/2022 3:03 PM  Troy Stevenson  has presented today for surgery, with the diagnosis of Anemia, hematochezia, GERD.  The various methods of treatment have been discussed with the patient and family. After consideration of risks, benefits and other options for treatment, the patient has consented to  EGD AND COLONOSCOPY WITH MODERATE SEDATION as a surgical intervention.  The patient's history has been reviewed, patient examined, no change in status, stable for surgery.  I have reviewed the patient's chart and labs.  Questions were answered to the patient's satisfaction.     Daryel November

## 2022-08-22 ENCOUNTER — Inpatient Hospital Stay (HOSPITAL_COMMUNITY): Payer: 59

## 2022-08-22 ENCOUNTER — Observation Stay (HOSPITAL_COMMUNITY): Payer: 59

## 2022-08-22 DIAGNOSIS — Z79899 Other long term (current) drug therapy: Secondary | ICD-10-CM | POA: Diagnosis not present

## 2022-08-22 DIAGNOSIS — K640 First degree hemorrhoids: Secondary | ICD-10-CM | POA: Diagnosis not present

## 2022-08-22 DIAGNOSIS — K317 Polyp of stomach and duodenum: Secondary | ICD-10-CM | POA: Diagnosis not present

## 2022-08-22 DIAGNOSIS — R1310 Dysphagia, unspecified: Secondary | ICD-10-CM | POA: Diagnosis not present

## 2022-08-22 DIAGNOSIS — D509 Iron deficiency anemia, unspecified: Secondary | ICD-10-CM

## 2022-08-22 DIAGNOSIS — K219 Gastro-esophageal reflux disease without esophagitis: Secondary | ICD-10-CM | POA: Diagnosis not present

## 2022-08-22 DIAGNOSIS — F419 Anxiety disorder, unspecified: Secondary | ICD-10-CM | POA: Diagnosis present

## 2022-08-22 DIAGNOSIS — R935 Abnormal findings on diagnostic imaging of other abdominal regions, including retroperitoneum: Secondary | ICD-10-CM | POA: Diagnosis not present

## 2022-08-22 DIAGNOSIS — K5730 Diverticulosis of large intestine without perforation or abscess without bleeding: Secondary | ICD-10-CM | POA: Diagnosis not present

## 2022-08-22 DIAGNOSIS — Z86010 Personal history of colonic polyps: Secondary | ICD-10-CM | POA: Diagnosis not present

## 2022-08-22 DIAGNOSIS — R69 Illness, unspecified: Secondary | ICD-10-CM | POA: Diagnosis not present

## 2022-08-22 DIAGNOSIS — D649 Anemia, unspecified: Secondary | ICD-10-CM | POA: Diagnosis not present

## 2022-08-22 DIAGNOSIS — K625 Hemorrhage of anus and rectum: Secondary | ICD-10-CM | POA: Diagnosis not present

## 2022-08-22 LAB — CBC
HCT: 30.5 % — ABNORMAL LOW (ref 39.0–52.0)
Hemoglobin: 8.2 g/dL — ABNORMAL LOW (ref 13.0–17.0)
MCH: 17.6 pg — ABNORMAL LOW (ref 26.0–34.0)
MCHC: 26.9 g/dL — ABNORMAL LOW (ref 30.0–36.0)
MCV: 65.5 fL — ABNORMAL LOW (ref 80.0–100.0)
Platelets: 315 10*3/uL (ref 150–400)
RBC: 4.66 MIL/uL (ref 4.22–5.81)
RDW: 27.9 % — ABNORMAL HIGH (ref 11.5–15.5)
WBC: 6.3 10*3/uL (ref 4.0–10.5)
nRBC: 0.3 % — ABNORMAL HIGH (ref 0.0–0.2)

## 2022-08-22 LAB — BASIC METABOLIC PANEL
Anion gap: 8 (ref 5–15)
BUN: 17 mg/dL (ref 6–20)
CO2: 24 mmol/L (ref 22–32)
Calcium: 8.9 mg/dL (ref 8.9–10.3)
Chloride: 102 mmol/L (ref 98–111)
Creatinine, Ser: 0.78 mg/dL (ref 0.61–1.24)
GFR, Estimated: >60 mL/min (ref >60)
Glucose, Bld: 108 mg/dL — ABNORMAL HIGH (ref 70–99)
Potassium: 4 mmol/L (ref 3.5–5.1)
Sodium: 134 mmol/L — ABNORMAL LOW (ref 135–145)

## 2022-08-22 LAB — HEMOGLOBIN AND HEMATOCRIT, BLOOD
HCT: 32.7 % — ABNORMAL LOW (ref 39.0–52.0)
Hemoglobin: 9.1 g/dL — ABNORMAL LOW (ref 13.0–17.0)

## 2022-08-22 MED ORDER — BARIUM SULFATE 0.1 % PO SUSP
ORAL | Status: AC
  Administered 2022-08-22: 450 mL via ORAL
  Filled 2022-08-22: qty 3

## 2022-08-22 MED ORDER — BARIUM SULFATE 0.1 % PO SUSP: 450.0000 mL | Freq: Once | ORAL | Status: AC

## 2022-08-22 MED ORDER — IOHEXOL 350 MG/ML SOLN
75.0000 mL | Freq: Once | INTRAVENOUS | Status: AC | PRN
  Administered 2022-08-22: 75 mL via INTRAVENOUS

## 2022-08-22 NOTE — Progress Notes (Signed)
[facility] Progress Note  CC:  Hematochezia, anemia   Subjective: He denies having any nausea or vomiting.  No abdominal pain.  No bowel movement or rectal bleeding post EGD and colonoscopy.  He tolerated a regular breakfast this morning.  Chest pain or shortness of breath.  Objective:   Vital signs in last 24 hours: Temp:  [97.5 F (36.4 C)-98.6 F (37 C)] 98.4 F (36.9 C) (02/17 0713) Pulse Rate:  [63-100] 73 (02/17 0713) Resp:  [11-22] 16 (02/17 0713) BP: (88-125)/(52-75) 105/69 (02/17 0713) SpO2:  [93 %-100 %] 100 % (02/17 0713) Last BM Date : 08/21/22 General:   Alert, well-developed in NAD. Heart: Regular rate and rhythm, no murmurs. Pulm: Breath sounds clear throughout. Abdomen: Soft, nondistended.  Nontender.  Positive bowel sounds to all 4 quadrants. Extremities:  Without edema. Neurologic:  Alert and  oriented x 4. Grossly normal neurologically. Psych:  Alert and cooperative. Normal mood and affect.  Intake/Output from previous day: 02/16 0701 - 02/17 0700 In: 240 [P.O.:240] Out: -  Intake/Output this shift: No intake/output data recorded.  Lab Results: Recent Labs    08/20/22 0619 08/20/22 1410 08/21/22 0416 08/22/22 0301  WBC 5.8  --  5.5 6.3  HGB 6.5* 8.8* 9.2* 8.2*  HCT 24.7* 30.6* 32.7* 30.5*  PLT 268  --  311 315   BMET Recent Labs    08/20/22 0619 08/21/22 0416 08/22/22 0301  NA 134* 138 134*  K 3.8 4.6 4.0  CL 100 107 102  CO2 22 21* 24  GLUCOSE 97 126* 108*  BUN 14 13 17  $ CREATININE 0.73 0.91 0.78  CALCIUM 8.7* 9.5 8.9   LFT Recent Labs    08/19/22 1944  PROT 7.3  ALBUMIN 3.9  AST 19  ALT 12  ALKPHOS 88  BILITOT 0.5   PT/INR Recent Labs    08/19/22 1944  LABPROT 14.1  INR 1.1   Hepatitis Panel No results for input(s): "HEPBSAG", "HCVAB", "HEPAIGM", "HEPBIGM" in the last 72 hours.  No results found.  Assessment / Plan:  57 year old male with a history of hematochezia (had active hematochezia  Dec 2023 x 1 month, no obvious bleeding since then who was admitted to the hospital 08/19/2022 with symptomatic anemia. Hemoglobin 5.8 transfused 1 unit of PRBCs -> Hg 6.6 -> 2nd unit of PRBCs ordered. Colonoscopy 08/2018 done for rectal bleeding showed two inflammatory polyps and an anal nodule, path showed polypoid/inflammed granulation tissue thought to be the source of bleeding at that time. Diverticulosis also noted. S/P colonoscopy 08/21/2022 showed diverticulosis in the sigmoid and descending colon, nonbleeding internal hemorrhoids which is likely the source of the patient's intermittent hematochezia but does not explain his severe iron deficiency anemia.  EGD identified multiple gastric polyps which were resected, likely fundic gland polyps otherwise was normal.  Small bowel capsule endoscopy recommended, patient stated he is unable to swallow large pills therefore he declined VCE.  No hematochezia overnight.  Hemoglobin 9.2 dropped to 8.2.  Hemodynamically stable. -Diet as tolerated -Repeat H&H at noon -Recommend IV iron during this hospitalization -Patient declined small bowel capsule endoscopy, sitter capsule placement endoscopically as an inpatient versus outpatient -Await further recommendations per Dr. Havery Moros   History of GERD, on Omeprazole 20 mg daily x 3 years. -Continue PPI IV twice daily   Unintentional weight loss, (15 lbs over the past 1 to 2 months) -Consider CTAP imaging during this hospitalization    Principal Problem:   Symptomatic anemia Active Problems:  GI bleed   Iron deficiency anemia   Fundic gland polyposis of stomach     LOS: 0 days   Noralyn Pick  08/22/2022, 9:49AM

## 2022-08-22 NOTE — Progress Notes (Signed)
PROGRESS NOTE Troy Stevenson  [id number] DOB: Sep 20, 1965 DOA: 08/19/2022 PCP: Patient, No Pcp Per   Brief Narrative/Hospital Course:  57 y.o. male with medical history significant of bleeding colon polyps and internal hemorrhoids in 2020, with h/o bleeding polyp in 2020, 2 polyps removed, also internal hemorrhoids, next repeat colonoscopy was going to be either 7 or 10 years depending on wether pathology was precancerous or not (looks like it was non-precancerous from what I can tell) presented w/ intermittent rectal bleeding when he wipes for the past 1-2 weeks.  Worsening fatigue in that time frame.  Does drink 3-4 beers daily.  Appetite loss and wt loss of 13 lbs over past 2 months.  No N/V prior transfusion. In the ED anemic hemoglobin 5.8  gm, previous hemoglobin 9.7 on admission 08/20/2018, GI was consulted, and admitted for further management. SEEN by GI- planning for colo and EGD. S/p 2 units prbc and HB has improved. Found to have severe anemia- iron infusion ordered as well.   08/22/2022: Endoscopy was nonrevealing.  GI team has advised CT enterography.  If CT enterography comes back negative, patient will follow-up with GI team on outpatient basis as patient may still need capsule endoscopy.  Patient is worried about swallowing endoscopic capsule.     Subjective: Patient seen. No new complaints.   Assessment and Plan: Principal Problem:   Symptomatic anemia Active Problems:   GI bleed   Iron deficiency anemia   Fundic gland polyposis of stomach   Symptomatic anemia Iron deficiency anemia History of GI bleeding-suspect chronic GI bleed gerd: Transfused 2 units PRBC> hh stable. Cont PPI.GI was consulted, planning for endoscopic evaluation, hemoglobin has stabilized since transfusion, will do iron infusion for severe iron deficiency.  Unintentional weight loss 15 pounds over the past 1 to 25-month See above.  plan as per GI  08/22/2022: Negative endoscopy.  For CT  enterography.  If CT enterography comes back negative, for capsule endoscopy on outpatient basis.  GI team is directing care.  DVT prophylaxis: SCDs Start: 08/20/22 0457 Code Status:   Code Status: Full Code Family Communication: plan of care discussed with patient/ at bedside. Patient status is: admitted as observation but remains hospitalized for ongoing  because of GI bleeding Level of care: Telemetry Medical   Dispo: The patient is from: home            Anticipated disposition: home  pending egd/colo results  Objective: Vitals last 24 hrs: Vitals:   08/21/22 2329 08/22/22 0447 08/22/22 0713 08/22/22 1547  BP: 122/69 108/71 105/69 109/71  Pulse: 72 77 73 94  Resp: 17 18 16 16  $ Temp: 98 F (36.7 C) 98.6 F (37 C) 98.4 F (36.9 C) 98.4 F (36.9 C)  TempSrc: Oral Oral Oral Oral  SpO2: 95% 99% 100% 100%  Weight:      Height:       Weight change:   Physical Examination: General exam: Awake and alert.  Not in any distress. HEENT:Patient is pale. Respiratory system: Clear to auscultation. Cardiovascular system: S1 & S2   Gastrointestinal system: Abdomen soft and non tender. Nervous System: Awake and alert.  Patient moves all extremities. Extremities: No leg edema.  Medications reviewed:  Scheduled Meds:  barium  450 mL Oral Once   barium       pantoprazole (PROTONIX) IV  40 mg Intravenous Q12H   Continuous Infusions:     Diet Order             Diet  regular Room service appropriate? Yes; Fluid consistency: Thin  Diet effective now                   Intake/Output Summary (Last 24 hours) at 08/22/2022 1624 Last data filed at 08/21/2022 2118 Gross per 24 hour  Intake 240 ml  Output --  Net 240 ml    Net IO Since Admission: 2,248.84 mL [08/22/22 1624]  Wt Readings from Last 3 Encounters:  08/19/22 75.8 kg  08/26/18 81.2 kg  08/24/18 80.7 kg     Unresulted Labs (From admission, onward)     Start     Ordered   08/21/22 [id number]  Basic metabolic panel   Daily,   R      08/20/22 1424   08/21/22 0500  CBC  Daily,   R      08/20/22 1424          Data Reviewed: I have personally reviewed following labs and imaging studies CBC: Recent Labs  Lab 08/19/22 1944 08/20/22 0130 08/20/22 0619 08/20/22 1410 08/21/22 0416 08/22/22 0301 08/22/22 1206  WBC 7.6  --  5.8  --  5.5 6.3  --   NEUTROABS 5.6  --   --   --   --   --   --   HGB 5.8*   < > 6.5* 8.8* 9.2* 8.2* 9.1*  HCT 23.3*   < > 24.7* 30.6* 32.7* 30.5* 32.7*  MCV 60.4*  --  62.7*  --  64.5* 65.5*  --   PLT 309  --  268  --  311 315  --    < > = values in this interval not displayed.    Basic Metabolic Panel: Recent Labs  Lab 08/19/22 1944 08/20/22 0619 08/21/22 0416 08/22/22 0301  NA 134* 134* 138 134*  K 3.8 3.8 4.6 4.0  CL 100 100 107 102  CO2 24 22 21* 24  GLUCOSE 128* 97 126* 108*  BUN 6 14 13 17  $ CREATININE 0.92 0.73 0.91 0.78  CALCIUM 8.9 8.7* 9.5 8.9    GFR: Estimated Creatinine Clearance: 99.2 mL/min (by C-G formula based on SCr of 0.78 mg/dL). Liver Function Tests: Recent Labs  Lab 08/19/22 1944  AST 19  ALT 12  ALKPHOS 88  BILITOT 0.5  PROT 7.3  ALBUMIN 3.9    No results for input(s): "LIPASE", "AMYLASE" in the last 168 hours. No results for input(s): "AMMONIA" in the last 168 hours. Coagulation Profile: Recent Labs  Lab 08/19/22 1944  INR 1.1     No results found for this or any previous visit (from the past 240 hour(s)).  Antimicrobials: Anti-infectives (From admission, onward)    None      Culture/Microbiology    Component Value Date/Time   SDES BLOOD LEFT ANTECUBITAL 03/01/2017 1729   SPECREQUEST  03/01/2017 1729    BOTTLES DRAWN AEROBIC AND ANAEROBIC Blood Culture adequate volume   CULT NO GROWTH 5 DAYS 03/01/2017 1729   REPTSTATUS 03/06/2017 FINAL 03/01/2017 1729  Radiology Studies: No results found.   LOS: 0 days   Bonnell Public, MD [facility]  08/22/2022, 4:24 PM

## 2022-08-23 ENCOUNTER — Encounter (HOSPITAL_COMMUNITY): Payer: Self-pay | Admitting: Gastroenterology

## 2022-08-23 DIAGNOSIS — D649 Anemia, unspecified: Secondary | ICD-10-CM | POA: Diagnosis not present

## 2022-08-23 DIAGNOSIS — K317 Polyp of stomach and duodenum: Secondary | ICD-10-CM | POA: Diagnosis not present

## 2022-08-23 DIAGNOSIS — D509 Iron deficiency anemia, unspecified: Secondary | ICD-10-CM | POA: Diagnosis not present

## 2022-08-23 LAB — BASIC METABOLIC PANEL WITH GFR
Anion gap: 9 (ref 5–15)
BUN: 13 mg/dL (ref 6–20)
CO2: 24 mmol/L (ref 22–32)
Calcium: 9 mg/dL (ref 8.9–10.3)
Chloride: 101 mmol/L (ref 98–111)
Creatinine, Ser: 0.83 mg/dL (ref 0.61–1.24)
GFR, Estimated: >60 mL/min
Glucose, Bld: 100 mg/dL — ABNORMAL HIGH (ref 70–99)
Potassium: 3.6 mmol/L (ref 3.5–5.1)
Sodium: 134 mmol/L — ABNORMAL LOW (ref 135–145)

## 2022-08-23 LAB — CBC
HCT: 30.1 % — ABNORMAL LOW (ref 39.0–52.0)
Hemoglobin: 8.3 g/dL — ABNORMAL LOW (ref 13.0–17.0)
MCH: 17.9 pg — ABNORMAL LOW (ref 26.0–34.0)
MCHC: 27.6 g/dL — ABNORMAL LOW (ref 30.0–36.0)
MCV: 65 fL — ABNORMAL LOW (ref 80.0–100.0)
Platelets: 328 K/uL (ref 150–400)
RBC: 4.63 MIL/uL (ref 4.22–5.81)
RDW: 28.5 % — ABNORMAL HIGH (ref 11.5–15.5)
WBC: 7 K/uL (ref 4.0–10.5)
nRBC: 0 % (ref 0.0–0.2)

## 2022-08-23 NOTE — Progress Notes (Signed)
Progress Note   Subjective  Patient feels well, denies complaints. No blood per rectum.  Had a CT scan yesterday.   Objective   Vital signs in last 24 hours: Temp:  [98.1 F (36.7 C)-98.5 F (36.9 C)] 98.1 F (36.7 C) (02/18 0753) Pulse Rate:  [70-94] 70 (02/18 0753) Resp:  [16-17] 16 (02/18 0753) BP: (109-128)/(71-77) 118/72 (02/18 0753) SpO2:  [100 %] 100 % (02/18 0753) Last BM Date : 08/22/22 General:    male in NAD Neurologic:  Alert and oriented,  grossly normal neurologically. Psych:  Cooperative. Normal mood and affect.  Intake/Output from previous day: 02/17 0701 - 02/18 0700 In: 360 [P.O.:360] Out: -  Intake/Output this shift: Total I/O In: 240 [P.O.:240] Out: -   Lab Results: Recent Labs    08/21/22 0416 08/22/22 0301 08/22/22 1206 08/23/22 0318  WBC 5.5 6.3  --  7.0  HGB 9.2* 8.2* 9.1* 8.3*  HCT 32.7* 30.5* 32.7* 30.1*  PLT 311 315  --  328   BMET Recent Labs    08/21/22 0416 08/22/22 0301 08/23/22 0318  NA 138 134* 134*  K 4.6 4.0 3.6  CL 107 102 101  CO2 21* 24 24  GLUCOSE 126* 108* 100*  BUN 13 17 13  $ CREATININE 0.91 0.78 0.83  CALCIUM 9.5 8.9 9.0   LFT No results for input(s): "PROT", "ALBUMIN", "AST", "ALT", "ALKPHOS", "BILITOT", "BILIDIR", "IBILI" in the last 72 hours. PT/INR No results for input(s): "LABPROT", "INR" in the last 72 hours.  Studies/Results: CT ENTERO ABD/PELVIS W CONTAST  Result Date: 08/22/2022 CLINICAL DATA:  56 year old male with history of iron-deficiency anemia. EXAM: CT ABDOMEN AND PELVIS WITH CONTRAST (ENTEROGRAPHY) TECHNIQUE: Multidetector CT of the abdomen and pelvis during bolus administration of intravenous contrast. Negative oral contrast was given. RADIATION DOSE REDUCTION: This exam was performed according to the departmental dose-optimization program which includes automated exposure control, adjustment of the mA and/or kV according to patient size and/or use of iterative reconstruction  technique. CONTRAST:  37m OMNIPAQUE IOHEXOL 350 MG/ML SOLN COMPARISON:  No priors. FINDINGS: Lower chest:  Unremarkable. Hepatobiliary: No suspicious cystic or solid hepatic lesions. No intra or extrahepatic biliary ductal dilatation. Gallbladder is normal in appearance. Pancreas: No pancreatic mass. No pancreatic ductal dilatation. No pancreatic or peripancreatic fluid collections or inflammatory changes. Spleen: Unremarkable. Adrenals/Urinary Tract: Bilateral kidneys and bilateral adrenal glands are normal in appearance. No hydroureteronephrosis. Urinary bladder is normal in appearance. Stomach/Bowel: The appearance of the stomach is normal. No pathologic dilatation of small bowel or colon. Enterography images demonstrate no focal areas of mural thickening, mucosal hyperenhancement or surrounding inflammatory changes associated with the stomach, small bowel or colon. Normal appendix. Vascular/Lymphatic: No significant atherosclerotic disease, aneurysm or dissection noted in the abdominal or pelvic vasculature. No lymphadenopathy noted in the abdomen or pelvis. Reproductive: Prostate gland and seminal vesicles are unremarkable in appearance. Other: No significant volume of ascites.  No pneumoperitoneum. Musculoskeletal: There are no aggressive appearing lytic or blastic lesions noted in the visualized portions of the skeleton. IMPRESSION: 1. No findings are noted in the abdomen or pelvis to account for the patient's history of iron-deficiency anemia. Electronically Signed   By: DVinnie LangtonM.D.   On: 08/22/2022 18:06       Assessment / Plan:    57year old male here for reassessment following:  Symptomatic iron deficiency anemia History of GI bleeding Fundic gland polyposis of the stomach  No overt cause of iron deficiency on EGD and colonoscopy.  He has not had any overt bleeding in over a month, his prior rectal bleeding could have been due to internal hemorrhoids noted on colonoscopy.  Ideally  we had discussed doing a capsule endoscopy to clear his small intestine, unfortunately due to severe pill dysphagia this was not possible (see prior notes, suspect this may be psychogenic).  In its place, we performed a CT enterography yesterday, this showed a normal small intestine without any concerning polypoid or mass lesions, which is reassuring and good news.  For now, he has not had any overt bleeding, his hemoglobin is stable, I think he stable to discharge home.  He has received blood transfusion and IV iron while here.  Our plan is to let him go home today, plan on repeating a CBC at our office this week to make sure hemoglobin stable.  We will otherwise then have him follow-up with Dr. Candis Schatz or one of our advanced providers in the next few weeks, to coordinate EGD with capsule placement to be done at the hospital to clear his small bowel.  At that time we also may empirically dilate his esophagus/UES to see if this will help his pill dysphagia.  In the interim he may need intermittent IV iron, he will let us know if he has any overt bleeding in the interim.  All questions answered he agrees with the plan:  PLAN: - okay to discharge home today - repeat CBC at our office this week (Thursday or Friday) - our office will order - contact us if any recurrent bleeding - plan on outpatient follow up in the upcoming few weeks and coordinate EGD with capsule endoscopy placement with empiric dilation of his esophagus - he is agreeable to this to complete his IDA workup - he will likely need future IV Iron infusions, as he cannot tolerate pills at this time  Call with questions, we will sign off for now.  Jolly Mango, MD [facility]

## 2022-08-23 NOTE — Plan of Care (Signed)
  Problem: Education: Goal: Knowledge of General Education information will improve Description: Including pain rating scale, medication(s)/side effects and non-pharmacologic comfort measures Outcome: Progressing   Problem: Clinical Measurements: Goal: Will remain free from infection Outcome: Progressing   Problem: Nutrition: Goal: Adequate nutrition will be maintained Outcome: Progressing   

## 2022-08-23 NOTE — Discharge Summary (Signed)
Physician Discharge Summary  Patient ID: Troy Stevenson MRN: [id number] DOB/AGE: Apr 12, 1966 57 y.o.  Admit date: 08/19/2022 Discharge date: 08/23/2022  Admission Diagnoses:  Discharge Diagnoses:  Principal Problem:   Symptomatic anemia Active Problems:   GI bleed   Iron deficiency anemia   Fundic gland polyposis of stomach   Discharged Condition: stable  Hospital Course: Patient is a 57 year old Hispanic male with medical history significant for GI bleeds, colonic polyps s/p polypectomy and internal hemorrhoids.  Patient was admitted with rectal bleed and symptomatic anemia.  On presentation, patient's hemoglobin was 5.8 g/dL.  Patient was admitted for further assessment and management.  Patient was transfused with 2 units of packed red blood cells.  GI team was consulted to assist with patient's management.  Patient underwent EGD and colonoscopy.  Colonoscopy revealed nonbleeding internal hemorrhoids and sigmoid and descending colon diverticula.  Upper GI scope revealed normal esophagus and multiple gastric polyps.  Polyps were extracted and sent for pathology.  Capsule endoscopy was recommended by GI team but patient was worried about swallowing endoscopy capsule.  Patient will follow-up with GI team on outpatient basis to address option of capsule endoscopy.  CT enterography was negative.  Patient has severe iron deficiency.  Hemoglobin today is 8.3 g/dL.  Patient will be discharged to the care of the primary care provider and the GI team.  Iron was 17.  IV iron infusion during the hospital stay.  We defer further management of iron deficiency to the primary care provider and GI team.    Symptomatic anemia Iron deficiency anemia History of GI bleeding-suspect chronic GI bleed gerd: Transfused 2 units PRBC> hh stable. Cont PPI.GI was consulted.  Patient underwent upper GI scope and colonoscopy.  Iron infusion for severe iron deficiency.  Unintentional weight loss 15 pounds over the  past 1 to 59-month See above.  plan as per GI  Consults: GI  Significant Diagnostic Studies:  Upper GI scope and colonoscopy (see above documentation).   Discharge Exam: Blood pressure 118/72, pulse 70, temperature 98.1 F (36.7 C), temperature source Oral, resp. rate 16, height 5' 5"$  (1.651 m), weight 75.8 kg, SpO2 100 %.   Disposition: Discharge disposition: 01-Home or Self Care       Discharge Instructions     Diet - low sodium heart healthy   Complete by: As directed    Discharge instructions   Complete by: As directed    Please call call MD or return to ER for similar or worsening recurring problem that brought you to hospital or if any fever,nausea/vomiting,abdominal pain, uncontrolled pain, chest pain,  shortness of breath or any other alarming symptoms.  Please follow-up your doctor as instructed in a week time and call the office for appointment.  Please avoid alcohol, smoking, or any other illicit substance and maintain healthy habits including taking your regular medications as prescribed.  You were cared for by a hospitalist during your hospital stay. If you have any questions about your discharge medications or the care you received while you were in the hospital after you are discharged, you can call the unit and ask to speak with the hospitalist on call if the hospitalist that took care of you is not available.  Once you are discharged, your primary care physician will handle any further medical issues. Please note that NO REFILLS for any discharge medications will be authorized once you are discharged, as it is imperative that you return to your primary care physician (or establish a relationship with  a primary care physician if you do not have one) for your aftercare needs so that they can reassess your need for medications and monitor your lab values   Increase activity slowly   Complete by: As directed    Increase activity slowly   Complete by: As directed        Allergies as of 08/23/2022   No Known Allergies      Medication List     TAKE these medications    Omeprazole 20 MG Tbec Take 2 tablets (40 mg total) by mouth daily. What changed: how much to take        [place] Follow up in 1 week(s).   Contact information: [place] [phone]                Signed: Bonnell Public 08/23/2022, 12:45 PM

## 2022-08-23 NOTE — TOC Transition Note (Signed)
Transition of Care [facility]) - CM/SW Discharge Note   Patient Details  Name: Troy Stevenson MRN: [id number] Date of Birth: 1965/08/21  [facility] CM/SW Contact:  Zenon Mayo, RN Phone Number: 08/23/2022, 2:12 PM   Clinical Narrative:    Patient is for dc, he will need to make follow up apt with [facility]  which is on [place].  He has no needs.         Patient Goals and CMS Choice      Discharge Placement                         Discharge Plan and Services Additional resources added to the After Visit Summary for                                       Social Determinants of Health (SDOH) Interventions SDOH Screenings   Food Insecurity: No Food Insecurity (08/22/2022)  Housing: Low Risk  (08/22/2022)  Transportation Needs: No Transportation Needs (08/22/2022)  Utilities: Not At Risk (08/22/2022)  Tobacco Use: Low Risk  (08/21/2022)     Readmission Risk Interventions     No data to display

## 2022-08-24 ENCOUNTER — Telehealth: Payer: Self-pay

## 2022-08-24 ENCOUNTER — Other Ambulatory Visit: Payer: Self-pay

## 2022-08-24 DIAGNOSIS — K922 Gastrointestinal hemorrhage, unspecified: Secondary | ICD-10-CM

## 2022-08-24 NOTE — Telephone Encounter (Signed)
Patient returned call and confirmed his appointment for 09/24/22.

## 2022-08-24 NOTE — Telephone Encounter (Signed)
Called patient to schedule follow up appointment with Dr.Cunningham Patient did not have VM set up. Scheduled patient for 09/24/22 the only appointment available for next month. I will try to contact patient again.

## 2022-08-24 NOTE — Telephone Encounter (Signed)
error 

## 2022-08-25 LAB — SURGICAL PATHOLOGY

## 2022-08-28 ENCOUNTER — Other Ambulatory Visit (INDEPENDENT_AMBULATORY_CARE_PROVIDER_SITE_OTHER): Payer: 59

## 2022-08-28 ENCOUNTER — Encounter: Payer: Self-pay | Admitting: Gastroenterology

## 2022-08-28 DIAGNOSIS — K922 Gastrointestinal hemorrhage, unspecified: Secondary | ICD-10-CM

## 2022-08-28 LAB — CBC WITH DIFFERENTIAL/PLATELET
Basophils Absolute: 0.1 10*3/uL (ref 0.0–0.1)
Basophils Relative: 1.2 % (ref 0.0–3.0)
Eosinophils Absolute: 0.1 10*3/uL (ref 0.0–0.7)
Eosinophils Relative: 3.1 % (ref 0.0–5.0)
HCT: 30.5 % — ABNORMAL LOW (ref 39.0–52.0)
Hemoglobin: 9.5 g/dL — ABNORMAL LOW (ref 13.0–17.0)
Lymphocytes Relative: 23.5 % (ref 12.0–46.0)
Lymphs Abs: 1.1 10*3/uL (ref 0.7–4.0)
MCHC: 31.1 g/dL (ref 30.0–36.0)
MCV: 63.2 fl — ABNORMAL LOW (ref 78.0–100.0)
Monocytes Absolute: 0.5 10*3/uL (ref 0.1–1.0)
Monocytes Relative: 10.6 % (ref 3.0–12.0)
Neutro Abs: 2.8 10*3/uL (ref 1.4–7.7)
Neutrophils Relative %: 61.6 % (ref 43.0–77.0)
Platelets: 320 10*3/uL (ref 150.0–400.0)
RBC: 4.83 Mil/uL (ref 4.22–5.81)
RDW: 34.5 % — ABNORMAL HIGH (ref 11.5–15.5)
WBC: 4.5 10*3/uL (ref 4.0–10.5)

## 2022-08-31 NOTE — Progress Notes (Signed)
Troy Stevenson,  Your blood counts were stable.  It is very unlikely that you are actively bleeding.  Will discuss further evaluation at your appointment in a few weeks.

## 2022-09-24 ENCOUNTER — Ambulatory Visit (INDEPENDENT_AMBULATORY_CARE_PROVIDER_SITE_OTHER): Payer: 59 | Admitting: Gastroenterology

## 2022-09-24 ENCOUNTER — Other Ambulatory Visit (INDEPENDENT_AMBULATORY_CARE_PROVIDER_SITE_OTHER): Payer: 59

## 2022-09-24 ENCOUNTER — Encounter: Payer: Self-pay | Admitting: Gastroenterology

## 2022-09-24 VITALS — BP 112/62 | HR 65 | Ht 65.0 in | Wt 169.0 lb

## 2022-09-24 DIAGNOSIS — K640 First degree hemorrhoids: Secondary | ICD-10-CM

## 2022-09-24 DIAGNOSIS — D509 Iron deficiency anemia, unspecified: Secondary | ICD-10-CM

## 2022-09-24 LAB — CBC WITH DIFFERENTIAL/PLATELET
Basophils Absolute: 0.1 10*3/uL (ref 0.0–0.1)
Basophils Relative: 1.2 % (ref 0.0–3.0)
Eosinophils Absolute: 0.1 10*3/uL (ref 0.0–0.7)
Eosinophils Relative: 1.7 % (ref 0.0–5.0)
HCT: 32.2 % — ABNORMAL LOW (ref 39.0–52.0)
Hemoglobin: 10 g/dL — ABNORMAL LOW (ref 13.0–17.0)
Lymphocytes Relative: 20.5 % (ref 12.0–46.0)
Lymphs Abs: 1.4 10*3/uL (ref 0.7–4.0)
MCHC: 31.1 g/dL (ref 30.0–36.0)
MCV: 64.6 fl — ABNORMAL LOW (ref 78.0–100.0)
Monocytes Absolute: 0.5 10*3/uL (ref 0.1–1.0)
Monocytes Relative: 6.7 % (ref 3.0–12.0)
Neutro Abs: 4.9 10*3/uL (ref 1.4–7.7)
Neutrophils Relative %: 69.9 % (ref 43.0–77.0)
Platelets: 264 10*3/uL (ref 150.0–400.0)
RBC: 4.98 Mil/uL (ref 4.22–5.81)
RDW: 29.5 % — ABNORMAL HIGH (ref 11.5–15.5)
WBC: 7 10*3/uL (ref 4.0–10.5)

## 2022-09-24 NOTE — Progress Notes (Signed)
HPI : Troy Stevenson is a very pleasant 57 year old male with a history of anxiety, GERD and hemorrhoids who presents to clinic today for follow up of recent hospitalization for severe iron deficiency anemia.  He was admitted after a clinic visit for fatigue showed a hgb of 5.8, MCV 60 and very low iron indices (ferritin 1, iron sat 3%).  He has a prior history of rectal bleeding and iron deficiency with hgb 9.7, MCV 75 and he underwent a colonoscopy by Dr. Ardis Hughs 08/26/2018 which identified two 2 to 5 mm polyps removed from the transverse colon and a tubular friable 7-8 mm long nodule in the proximal anus which was thought to be the source of his rectal bleeding and anemia.  Left colon diverticulosis was also noted.  Path report showed the polyps were inflammatory polyps in the distal rectal nodule biopsies were consistent with polypoid granulation tissue.  Following this colonoscopy, he continued to have intermittent rectal bleeding which eventually abated.  In December 2023, he passed a small amount of bright red blood with each bowel movement for 1 month then stopped 1 month ago without recurrence.    After admission he was transfused 2 units pRBCs with improvement of hgb to 9 and he underwent an EGD and colonoscopy on Feb 16, which were unrevealing for a source of anemia.  A capsule endoscopy was recommended but the patient reports he would be unable to swallow a pill camera.  A CT enterography on Feb 17th was unremarkable.  As his hemoglobin remained stable and he had no overt bleeding, he was discharged with  plans for potential endoscopy-assisted capsule endoscopy.   Today, he reports feeling well.  He had an episode of painless hematochezia x 2  about 2 days ago, but otherwise has not had any overt GI bleeding or any other GI symptoms.  His GERD symptoms are adequately controlled with once daily omeprazole. His appetite is good and he has regained some of the weight he lost.  He has noted some  loose stools recently when he ate ice cream and drank milk. He is not taking an oral iron supplement.   PAST GI PROCEDURES: Colonoscopy Aug 21, 2022  Diverticulosis of the left colon Internal hemorrhoids Otherwise normal colonoscopy  EGD Aug 21, 2022 - Normal esophagus. - Multiple gastric polyps. Resected and partially retrieved. These appeared consistent with fundic gland polyps. The patient takes omeprazole daily.  - Normal mucosa was found in the entire stomach. Biopsied.  - Normal examined duodenum. Biopsied.  - It is doubtful (but possible) that these polyps explain his iron deficiency anemia  FINAL MICROSCOPIC DIAGNOSIS:  A. DUODENUM, BIOPSY: - Duodenal mucosa with no specific histopathologic changes - Negative for increased intraepithelial lymphocytes or villous architectural changes  B. STOMACH, BIOPSY: - Gastric antral and oxyntic mucosa with no specific histopathologic changes - Helicobacter pylori-like organisms are not identified on routine HE stain  C. STOMACH, POLYPECTOMY: - Fundic gland polyp(s) - Negative for dysplasia   CT Enterography Aug 22, 2022 IMPRESSION: 1. No findings are noted in the abdomen or pelvis to account for the patient's history of iron-deficiency anemia  Colonoscopy 08/26/2018 by Dr. Ardis Hughs: Two 2 to 5 mm polyps in the transverse colon removed with a cold snare and resected Tubular friable 7-8 mm long nodule in the proximal anus just proximal to small internal hemorrhoids Left-sided diverticulosis Recall colonoscopy 10 years  1. Surgical [P], descending, polyp (2) - INFLAMMATORY POLYP (S). - NO ADENOMATOUS CHANGE  OR MALIGNANCY. 2. Surgical [P], distal rectal nodule, polyp - POLYPOID, MILDLY INFLAMMED GRANULATION TISSUE. - NO ADENOMATOUS CHANGE OR MALIGNANCY  Component Ref Range & Units 1 mo ago  Ferritin 24 - 336 ng/mL 1 Low    Component Ref Range & Units 1 mo ago  Iron 45 - 182 ug/dL 17 Low   TIBC 250 - 450 ug/dL 535  High   Saturation Ratios 17.9 - 39.5 % 3 Low   UIBC ug/dL 518    Past Medical History:  Diagnosis Date   Anxiety    Hepatitis      Past Surgical History:  Procedure Laterality Date   BIOPSY  08/21/2022   Procedure: BIOPSY;  Surgeon: Daryel November, MD;  Location: [facility] ENDOSCOPY;  Service: Gastroenterology;;   COLONOSCOPY WITH PROPOFOL N/A 08/21/2022   Procedure: COLONOSCOPY WITH PROPOFOL;  Surgeon: Daryel November, MD;  Location: [facility] ENDOSCOPY;  Service: Gastroenterology;  Laterality: N/A;   ESOPHAGOGASTRODUODENOSCOPY (EGD) WITH PROPOFOL N/A 08/21/2022   Procedure: ESOPHAGOGASTRODUODENOSCOPY (EGD) WITH PROPOFOL;  Surgeon: Daryel November, MD;  Location: [place];  Service: Gastroenterology;  Laterality: N/A;   POLYPECTOMY  08/21/2022   Procedure: POLYPECTOMY;  Surgeon: Daryel November, MD;  Location: [facility] ENDOSCOPY;  Service: Gastroenterology;;   Family History  Problem Relation Age of Onset   Hypertension Father    Diabetes Father    Colon cancer Neg Hx    Rectal cancer Neg Hx    Social History   Tobacco Use   Smoking status: Never   Smokeless tobacco: Never  Substance Use Topics   Alcohol use: Yes    Comment: 4-5 beers daily   Drug use: No   Current Outpatient Medications  Medication Sig Dispense Refill   Omeprazole 20 MG TBEC Take 2 tablets (40 mg total) by mouth daily. 30 tablet    No current facility-administered medications for this visit.   No Known Allergies   Review of Systems: All systems reviewed and negative except where noted in HPI.    No results found.  Physical Exam: BP 112/62   Pulse 65   Ht 5\' 5"  (1.651 m)   Wt 169 lb (76.7 kg)   BMI 28.12 kg/m  Constitutional: Pleasant,well-developed, Hispanic male in no acute distress. HEENT: Normocephalic and atraumatic. Conjunctivae are normal. No scleral icterus. Cardiovascular: Normal rate, regular rhythm.  Pulmonary/chest: Effort normal and breath sounds normal. No wheezing, rales  or rhonchi. Abdominal: Soft, nondistended, nontender. Bowel sounds active throughout. There are no masses palpable. No hepatomegaly. Extremities: no edema Neurological: Alert and oriented to person place and time. Skin: Skin is warm and dry. No rashes noted. Psychiatric: Normal mood and affect. Behavior is normal.  CBC    Component Value Date/Time   WBC 4.5 08/28/2022 0749   RBC 4.83 08/28/2022 0749   HGB 9.5 (L) 08/28/2022 0749   HCT 30.5 (L) 08/28/2022 0749   PLT 320.0 08/28/2022 0749   MCV 63.2 Repeated and verified X2. (L) 08/28/2022 0749   MCH 17.9 (L) 08/23/2022 0318   MCHC 31.1 08/28/2022 0749   RDW 34.5 (H) 08/28/2022 0749   LYMPHSABS 1.1 08/28/2022 0749   MONOABS 0.5 08/28/2022 0749   EOSABS 0.1 08/28/2022 0749   BASOSABS 0.1 08/28/2022 0749    CMP     Component Value Date/Time   NA 134 (L) 08/23/2022 0318   K 3.6 08/23/2022 0318   CL 101 08/23/2022 0318   CO2 24 08/23/2022 0318   GLUCOSE 100 (H) 08/23/2022 [id number]  BUN 13 08/23/2022 0318   CREATININE 0.83 08/23/2022 0318   CALCIUM 9.0 08/23/2022 0318   PROT 7.3 08/19/2022 1944   ALBUMIN 3.9 08/19/2022 1944   AST 19 08/19/2022 1944   ALT 12 08/19/2022 1944   ALKPHOS 88 08/19/2022 1944   BILITOT 0.5 08/19/2022 1944   GFRNONAA >60 08/23/2022 0318   GFRAA >60 08/20/2018 1540     ASSESSMENT AND PLAN:  57 year old male with chronic anemia, recently admitted with symptomatic severe iron deficiency anemia.  He has occasional bright red blood per rectum and had hemorrhoids noted on colonoscopy x2. Although his hematochezia can reliably be attributed to his hemorrhoids, it is very unlikely that his severe iron deficiency anemia is from hemorrhoidal blood loss.  It is also unlikely that the gastric fundic gland polyps were a source of chronic bleeding (they did not appear friable, no blood in stomach). It is possible that his chronic PPI use may be the underlying etiology, but a capsule endoscopy should be performed to  exclude mass lesion as well as benign lesions such as AVMs.  As the patient reports an inability to swallow large pills, we will plan for EGD with endoscopically placed pill camera in the duodenum. We discussed the anatomy and physiology of hemorrhoids, as well as the principles of hemorrhoid management to include optimization of bowel habits, with a goal of passing a soft formed stool daily without straining, avoidance of hard stools and straining, limiting time on the toilet to less than 5 minutes.  We discussed the role of fiber in optimizing the stool bulk and consistency.  We also discussed the role of hemorrhoid banding for persistent symptoms.  Iron deficiency anemia - EGD with video capsule endoscopy deployment - Repeat CBC with iron panel - If hgb not increasing, recommend patient start taking oral iron   Hemorrhoids - Recommended Metamucil/high fiber diet  GERD - Continue omeprazole for now.  The details, risks (including bleeding, perforation, infection, missed lesions, medication reactions and possible hospitalization or surgery if complications occur), benefits, and alternatives to EGD with pill camera deployment were discussed with the patient and he consents to proceed.  This procedure will be performed in the hospital because of equipment limitations in the [place].  Xayne Brumbaugh E. Candis Schatz, MD [facility]   No ref. provider found

## 2022-09-24 NOTE — Patient Instructions (Signed)
You have been scheduled for an endoscopy. Please follow written instructions given to you at your visit today. If you use inhalers (even only as needed), please bring them with you on the day of your procedure.  Your provider has requested that you go to the basement level for lab work before leaving today. Press "B" on the elevator. The lab is located at the first door on the left as you exit the elevator.  CAPSULE ENDOSCOPY PATIENT INSTRUCTION SHEET  Troy Stevenson 1965-09-04 [id number]   12/07/22 Seven (7) days prior to capsule endoscopy stop taking iron supplements and carafate.  12/12/22 Two (2) days prior to capsule endoscopy stop taking aspirin or any arthritis drugs.  12/13/22 Day before capsule endoscopy purchase a 238 gram bottle of Miralax from the laxative section of your drug store, and a 32 oz. bottle of Gatorade (no red).    12/13/22 One (1) day prior to capsule endoscopy: Stop smoking. Eat a regular diet until 12:00 Noon. After 12:00 Noon take only the following: Black coffee  Jell-O (no fruit or red Jell-o) Water   Bouillon (chicken or beef) 7-Up   Cranberry Juice Tea   Kool-Aid Popsicle (not red) Sprite   Coke Ginger Ale  Pepsi Mountain Dew Gatorade At 6:00 pm the evening before your appointment, drink 7 capfuls (105 grams) of Miralax with 32 oz. Gatorade. Drink 8 oz every 15 minutes until gone. Nothing to eat or drink after midnight except medications with a sip of water.  12/14/22 Day of capsule endoscopy:  No medications for 4 hours prior to your test.  Small Bowel Capsule Endoscopy  What you should know: Small Bowel capsule endoscopy is a procedure that takes pictures of the inside of your small intestine (bowel).  Your small bowel connects to your stomach on one end, and your large bowel (colon) on the other.  A capsule endoscopy is done by swallowing a pill size camera.  The capsule moves through your stomach and into your small bowel, where pictures are  taken.   You may need a small bowel capsule endoscopy if you have symptoms, such as blood in your stool, chronic stomach pain, and diarrhea.  The pictures may show if you have growths, swelling, and bleeding area in you small bowel.  A capsule endoscopy may also show if diseases such as Crohn's or celiac disease are causing your symptoms.  Having a small bowel capsule endoscopy may help you and your caregiver learn the cause of your symptoms.  Learning what is causing your symptoms allows you to receive needed treatment and prevent further problems. Risks: You may have stomach pain during your procedure.   The pictures taken by the capsule may not be clear.   The pictures may not show the cause of your symptoms.   You may need another endoscopy procedure.   The capsule may get trapped in your esophagus or intestines. You may need surgery or additional procedures to remove the capsule from your body.    Before your procedure: You will be instructed to stop certain prescription medications or over- the -counter medications prior to the procedure.   The day before your scheduled appointment you will need to be on a restricted diet and will need to drink a bowel prep that will clean out your bowels.   The day of the procedure: You may drive yourself to the procedure.   You will need to plan on 2 trips to the office on the day of the  procedure. Morning: Plan to be at the office about 45 minutes. The morning of the procedure a sensor belt and recorder will be placed on you.  You will wear this for 8 hours.  (The sensor belt transfers pictures of your small bowel to the recorder.)   You will be given a pill-sized capsule endoscope to swallow.  Once you swallow the capsule it will travel through your body the same way food does, constantly taking pictures along the way.  The capsule takes 2-3 pictures a second.   Once you have left the office you may go about your normal day with a few exceptions: You  may not go near a MRI machine or a radio or television towers; You need to avoid other patients having capsule endoscopy; You will be given a written diet to follow for the day.  Afternoon: You will need to be return to the office at your designated time. The sensors belt will be removed You will need to be at the office about 15 minutes.   _______________________________________________________  If your blood pressure at your visit was 140/90 or greater, please contact your primary care physician to follow up on this.  _______________________________________________________  If you are age 56 or older, your body mass index should be between 23-30. Your Body mass index is 28.12 kg/m. If this is out of the aforementioned range listed, please consider follow up with your Primary Care Provider.  If you are age 56 or younger, your body mass index should be between 19-25. Your Body mass index is 28.12 kg/m. If this is out of the aformentioned range listed, please consider follow up with your Primary Care Provider.   ________________________________________________________  The [place] [facility] providers would like to encourage you to use [facility] to communicate with providers for non-urgent requests or questions.  Due to long hold times on the telephone, sending your provider a message by [facility] may be a faster and more efficient way to get a response.  Please allow 48 business hours for a response.  Please remember that this is for non-urgent requests.  _______________________________________________________

## 2022-09-25 LAB — IBC + FERRITIN
Ferritin: 3.1 ng/mL — ABNORMAL LOW (ref 22.0–322.0)
Iron: 12 ug/dL — ABNORMAL LOW (ref 42–165)
Saturation Ratios: 2.2 % — ABNORMAL LOW (ref 20.0–50.0)
TIBC: 533.4 ug/dL — ABNORMAL HIGH (ref 250.0–450.0)
Transferrin: 381 mg/dL — ABNORMAL HIGH (ref 212.0–360.0)

## 2022-10-06 ENCOUNTER — Encounter: Payer: Self-pay | Admitting: Emergency Medicine

## 2022-10-06 ENCOUNTER — Ambulatory Visit (INDEPENDENT_AMBULATORY_CARE_PROVIDER_SITE_OTHER): Payer: 59 | Admitting: Emergency Medicine

## 2022-10-06 VITALS — BP 120/70 | HR 82 | Temp 98.4°F | Ht 65.0 in | Wt 173.0 lb

## 2022-10-06 DIAGNOSIS — D509 Iron deficiency anemia, unspecified: Secondary | ICD-10-CM

## 2022-10-06 DIAGNOSIS — F419 Anxiety disorder, unspecified: Secondary | ICD-10-CM | POA: Diagnosis not present

## 2022-10-06 DIAGNOSIS — Z7689 Persons encountering health services in other specified circumstances: Secondary | ICD-10-CM | POA: Diagnosis not present

## 2022-10-06 LAB — CBC WITH DIFFERENTIAL/PLATELET
Basophils Absolute: 0.1 10*3/uL (ref 0.0–0.1)
Basophils Relative: 1.1 % (ref 0.0–3.0)
Eosinophils Absolute: 0.1 10*3/uL (ref 0.0–0.7)
Eosinophils Relative: 2.3 % (ref 0.0–5.0)
HCT: 31.7 % — ABNORMAL LOW (ref 39.0–52.0)
Hemoglobin: 9.8 g/dL — ABNORMAL LOW (ref 13.0–17.0)
Lymphocytes Relative: 25.8 % (ref 12.0–46.0)
Lymphs Abs: 1.4 10*3/uL (ref 0.7–4.0)
MCHC: 31 g/dL (ref 30.0–36.0)
MCV: 65.2 fl — ABNORMAL LOW (ref 78.0–100.0)
Monocytes Absolute: 0.6 10*3/uL (ref 0.1–1.0)
Monocytes Relative: 11.2 % (ref 3.0–12.0)
Neutro Abs: 3.1 10*3/uL (ref 1.4–7.7)
Neutrophils Relative %: 59.6 % (ref 43.0–77.0)
Platelets: 303 10*3/uL (ref 150.0–400.0)
RBC: 4.87 Mil/uL (ref 4.22–5.81)
RDW: 27.1 % — ABNORMAL HIGH (ref 11.5–15.5)
WBC: 5.3 10*3/uL (ref 4.0–10.5)

## 2022-10-06 LAB — LIPID PANEL
Cholesterol: 141 mg/dL (ref 0–200)
HDL: 41.3 mg/dL (ref >39.00)
LDL Cholesterol: 83 mg/dL (ref 0–99)
NonHDL: 99.77
Total CHOL/HDL Ratio: 3
Triglycerides: 85 mg/dL (ref 0.0–149.0)
VLDL: 17 mg/dL (ref 0.0–40.0)

## 2022-10-06 LAB — COMPREHENSIVE METABOLIC PANEL
ALT: 15 U/L (ref 0–53)
AST: 20 U/L (ref 0–37)
Albumin: 4 g/dL (ref 3.5–5.2)
Alkaline Phosphatase: 103 U/L (ref 39–117)
BUN: 11 mg/dL (ref 6–23)
CO2: 29 mEq/L (ref 19–32)
Calcium: 8.9 mg/dL (ref 8.4–10.5)
Chloride: 104 mEq/L (ref 96–112)
Creatinine, Ser: 0.73 mg/dL (ref 0.40–1.50)
GFR: 102.56 mL/min (ref >60.00)
Glucose, Bld: 104 mg/dL — ABNORMAL HIGH (ref 70–99)
Potassium: 4 mEq/L (ref 3.5–5.1)
Sodium: 137 mEq/L (ref 135–145)
Total Bilirubin: 0.6 mg/dL (ref 0.2–1.2)
Total Protein: 7.1 g/dL (ref 6.0–8.3)

## 2022-10-06 LAB — HEMOGLOBIN A1C: Hgb A1c MFr Bld: 5.4 % (ref 4.6–6.5)

## 2022-10-06 LAB — TSH: TSH: 2.69 u[IU]/mL (ref 0.35–5.50)

## 2022-10-06 NOTE — Patient Instructions (Signed)
Mantenimiento de la salud en los hombres Health Maintenance, Male Adoptar un estilo de vida saludable y recibir atencin preventiva son importantes para promover la salud y el bienestar. Consulte al mdico sobre: El esquema adecuado para hacerse pruebas y exmenes peridicos. Cosas que puede hacer por su cuenta para prevenir enfermedades y mantenerse sano. Qu debo saber sobre la dieta, el peso y el ejercicio? Consuma una dieta saludable  Consuma una dieta que incluya muchas verduras, frutas, productos lcteos con bajo contenido de grasa y protenas magras. No consuma muchos alimentos ricos en grasas slidas, azcares agregados o sodio. Mantenga un peso saludable El ndice de masa muscular (IMC) es una medida que puede utilizarse para identificar posibles problemas de peso. Proporciona una estimacin de la grasa corporal basndose en el peso y la altura. Su mdico puede ayudarle a determinar su IMC y a lograr o mantener un peso saludable. Haga ejercicio con regularidad Haga ejercicio con regularidad. Esta es una de las prcticas ms importantes que puede hacer por su salud. La mayora de los adultos deben seguir estas pautas: Realizar, al menos, 150 minutos de actividad fsica por semana. El ejercicio debe aumentar la frecuencia cardaca y hacerlo transpirar (ejercicio de intensidad moderada). Hacer ejercicios de fortalecimiento por lo menos dos veces por semana. Agregue esto a su plan de ejercicio de intensidad moderada. Pase menos tiempo sentado. Incluso la actividad fsica ligera puede ser beneficiosa. Controle sus niveles de colesterol y lpidos en la sangre Comience a realizarse anlisis de lpidos y colesterol en la sangre a los 20 aos y luego reptalos cada 5 aos. Es posible que necesite controlar los niveles de colesterol con mayor frecuencia si: Sus niveles de lpidos y colesterol son altos. Es mayor de 40 aos. Presenta un alto riesgo de padecer enfermedades cardacas. Qu debo  saber sobre las pruebas de deteccin del cncer? Muchos tipos de cncer pueden detectarse de manera temprana y, a menudo, pueden prevenirse. Segn su historia clnica y sus antecedentes familiares, es posible que deba realizarse pruebas de deteccin del cncer en diferentes edades. Esto puede incluir pruebas de deteccin de lo siguiente: Cncer colorrectal. Cncer de prstata. Cncer de piel. Cncer de pulmn. Qu debo saber sobre la enfermedad cardaca, la diabetes y la hipertensin arterial? Presin arterial y enfermedad cardaca La hipertensin arterial causa enfermedades cardacas y aumenta el riesgo de accidente cerebrovascular. Es ms probable que esto se manifieste en las personas que tienen lecturas de presin arterial alta o tienen sobrepeso. Hable con el mdico sobre sus valores de presin arterial deseados. Hgase controlar la presin arterial: Cada 3 a 5 aos si tiene entre 18 y 39 aos. Todos los aos si es mayor de 40 aos. Si tiene entre 65 y 75 aos y es fumador o sola fumar, pregntele al mdico si debe realizarse una prueba de deteccin de aneurisma artico abdominal (AAA) por nica vez. Diabetes Realcese exmenes de deteccin de la diabetes con regularidad. Este anlisis revisa el nivel de azcar en la sangre en ayunas. Hgase las pruebas de deteccin: Cada tres aos despus de los 45 aos de edad si tiene un peso normal y un bajo riesgo de padecer diabetes. Con ms frecuencia y a partir de una edad inferior si tiene sobrepeso o un alto riesgo de padecer diabetes. Qu debo saber sobre la prevencin de infecciones? Hepatitis B Si tiene un riesgo ms alto de contraer hepatitis B, debe someterse a un examen de deteccin de este virus. Hable con el mdico para averiguar si tiene riesgo de   contraer la infeccin por hepatitis B. Hepatitis C Se recomienda un anlisis de sangre para: Todos los que nacieron entre 1945 y 1965. Todas las personas que tengan un riesgo de haber  contrado hepatitis C. Enfermedades de transmisin sexual (ETS) Debe realizarse pruebas de deteccin de ITS todos los aos, incluidas la gonorrea y la clamidia, si: Es sexualmente activo y es menor de 24 aos. Es mayor de 24 aos, y el mdico le informa que corre riesgo de tener este tipo de infecciones. La actividad sexual ha cambiado desde que le hicieron la ltima prueba de deteccin y tiene un riesgo mayor de tener clamidia o gonorrea. Pregntele al mdico si usted tiene riesgo. Pregntele al mdico si usted tiene un alto riesgo de contraer VIH. El mdico tambin puede recomendarle un medicamento recetado para ayudar a evitar la infeccin por el VIH. Si elige tomar medicamentos para prevenir el VIH, primero debe hacerse los anlisis de deteccin del VIH. Luego debe hacerse anlisis cada 3 meses mientras est tomando los medicamentos. Siga estas indicaciones en su casa: Consumo de alcohol No beba alcohol si el mdico se lo prohbe. Si bebe alcohol: Limite la cantidad que consume de 0 a 2 bebidas por da. Sepa cunta cantidad de alcohol hay en las bebidas que toma. En los [place], una medida equivale a una botella de cerveza de 12 oz (355 ml), un vaso de vino de 5 oz (148 ml) o un vaso de una bebida alcohlica de alta graduacin de 1 oz (44 ml). Estilo de vida No consuma ningn producto que contenga nicotina o tabaco. Estos productos incluyen cigarrillos, tabaco para mascar y aparatos de vapeo, como los cigarrillos electrnicos. Si necesita ayuda para dejar de consumir estos productos, consulte al mdico. No consuma drogas. No comparta agujas. Solicite ayuda a su mdico si necesita apoyo o informacin para abandonar las drogas. Indicaciones generales Realcese los estudios de rutina de la salud, dentales y de la vista. Mantngase al da con las vacunas. Infrmele a su mdico si: Se siente deprimido con frecuencia. Alguna vez ha sido vctima de maltrato o no se siente seguro en su  casa. Resumen Adoptar un estilo de vida saludable y recibir atencin preventiva son importantes para promover la salud y el bienestar. Siga las instrucciones del mdico acerca de una dieta saludable, el ejercicio y la realizacin de pruebas o exmenes para detectar enfermedades. Siga las instrucciones del mdico con respecto al control del colesterol y la presin arterial. Esta informacin no tiene como fin reemplazar el consejo del mdico. Asegrese de hacerle al mdico cualquier pregunta que tenga. Document Revised: 11/27/2020 Document Reviewed: 11/27/2020 Elsevier Patient Education  2023 Elsevier Inc.  

## 2022-10-06 NOTE — Assessment & Plan Note (Signed)
Well-controlled. History of panic attacks in the past Asymptomatic today

## 2022-10-06 NOTE — Progress Notes (Signed)
Mr. Mixell,  Your hemoglobin has not improved very much in the last month and your iron levels are still very low.  I would strongly recommend you take the oral iron supplements previously recommended.  Twice a day would be best if you can tolerate it.  I would also suggest you take them with some orange juice or vitamin C tablets, as iron is better absorbed with acid.   Will see you in June for your EGD/video capsule study.

## 2022-10-06 NOTE — Progress Notes (Signed)
[facility] 57 y.o.   Chief Complaint  Patient presents with   Establish Care    Pt was seeing [facility] before for PCP. wants to talk about his energy and complains of both legs having pain. RM 9.    HISTORY OF PRESENT ILLNESS: This is a 58 y.o. male first visit to this office, here to establish care with me. Non-smoker.  Occasional beer drinker. Recent episode of GI bleed.  Presented with rectal bleeding and hemoglobin of 5.  Was given iron transfusion. No history of diabetes or any chronic medical problems. Has occasional bilateral leg pain.  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  Takes Tylenol for pain. Recent lack of energy was due to anemia.  Feeling better. History of chronic anxiety.  Well-controlled these days. No other complaints or medical concerns today.  HPI   Prior to Admission medications   Medication Sig Start Date End Date Taking? Authorizing Provider  Omeprazole 20 MG TBEC Take 2 tablets (40 mg total) by mouth daily. 08/21/22  Yes Antonieta Pert, MD    No Known Allergies  Patient Active Problem List   Diagnosis Date Noted   Iron deficiency anemia 08/21/2022   Fundic gland polyposis of stomach 08/21/2022   Symptomatic anemia 08/19/2022   Anxiety 02/24/2013    Past Medical History:  Diagnosis Date   Anxiety    Hepatitis     Past Surgical History:  Procedure Laterality Date   BIOPSY  08/21/2022   Procedure: BIOPSY;  Surgeon: Daryel November, MD;  Location: [place];  Service: Gastroenterology;;   COLONOSCOPY WITH PROPOFOL N/A 08/21/2022   Procedure: COLONOSCOPY WITH PROPOFOL;  Surgeon: Daryel November, MD;  Location: [place];  Service: Gastroenterology;  Laterality: N/A;   ESOPHAGOGASTRODUODENOSCOPY (EGD) WITH PROPOFOL N/A 08/21/2022   Procedure: ESOPHAGOGASTRODUODENOSCOPY (EGD) WITH PROPOFOL;  Surgeon: Daryel November, MD;  Location: [place];  Service: Gastroenterology;  Laterality: N/A;    POLYPECTOMY  08/21/2022   Procedure: POLYPECTOMY;  Surgeon: Daryel November, MD;  Location[facility];  Service: Gastroenterology;;    Social History   Socioeconomic History   Marital status: Divorced    Spouse name: Not on file   Number of children: 1   Years of education: Not on file   Highest education level: Not on file  Occupational History   Not on file  Tobacco Use   Smoking status: Never   Smokeless tobacco: Never  Substance and Sexual Activity   Alcohol use: Yes    Comment: 4-5 beers daily   Drug use: No   Sexual activity: Not on file  Other Topics Concern   Not on file  Social History Narrative   Not on file   Social Determinants of Health   Financial Resource Strain: Not on file  Food Insecurity: No Food Insecurity (08/22/2022)   Hunger Vital Sign    Worried About Running Out of Food in the Last Year: Never true    Ran Out of Food in the Last Year: Never true  Transportation Needs: No Transportation Needs (08/22/2022)   PRAPARE - Hydrologist (Medical): No    Lack of Transportation (Non-Medical): No  Physical Activity: Not on file  Stress: Not on file  Social Connections: Not on file  Intimate Partner Violence: Not At Risk (08/22/2022)   Humiliation, Afraid, Rape, and Kick questionnaire    Fear of Current or Ex-Partner: No    Emotionally  Abused: No    Physically Abused: No    Sexually Abused: No    Family History  Problem Relation Age of Onset   Hypertension Father    Diabetes Father    Colon cancer Neg Hx    Rectal cancer Neg Hx      Review of Systems  Constitutional: Negative.  Negative for chills and fever.  HENT: Negative.  Negative for congestion and sore throat.   Respiratory: Negative.  Negative for cough and shortness of breath.   Cardiovascular: Negative.  Negative for chest pain and palpitations.  Gastrointestinal: Negative.  Negative for abdominal pain, blood in stool, diarrhea, melena, nausea and  vomiting.  Genitourinary: Negative.  Negative for dysuria and hematuria.  Skin: Negative.  Negative for rash.  Neurological: Negative.  Negative for dizziness and headaches.  All other systems reviewed and are negative.   Vitals:   10/06/22 0828  BP: 120/70  Pulse: 82  Temp: 98.4 F (36.9 C)  SpO2: 97%    Physical Exam Vitals reviewed.  Constitutional:      Appearance: Normal appearance.  HENT:     Head: Normocephalic.     Mouth/Throat:     Mouth: Mucous membranes are moist.     Pharynx: Oropharynx is clear.  Eyes:     Extraocular Movements: Extraocular movements intact.     Conjunctiva/sclera: Conjunctivae normal.     Pupils: Pupils are equal, round, and reactive to light.  Cardiovascular:     Rate and Rhythm: Normal rate and regular rhythm.     Pulses: Normal pulses.     Heart sounds: Normal heart sounds.  Pulmonary:     Effort: Pulmonary effort is normal.     Breath sounds: Normal breath sounds.  Abdominal:     Palpations: Abdomen is soft.     Tenderness: There is no abdominal tenderness.  Musculoskeletal:     Cervical back: No tenderness.  Lymphadenopathy:     Cervical: No cervical adenopathy.  Skin:    General: Skin is warm and dry.     Capillary Refill: Capillary refill takes less than 2 seconds.  Neurological:     General: No focal deficit present.     Mental Status: He is alert and oriented to person, place, and time.  Psychiatric:        Mood and Affect: Mood normal.        Behavior: Behavior normal.      ASSESSMENT & PLAN: A total of 47 minutes was spent with the patient and counseling/coordination of care regarding preparing for this visit, review of available medical records, establishing care with me, comprehensive history and physical examination, review of chronic medical conditions under management, review of all medications, education on nutrition, review of health maintenance items, prognosis, documentation, and need for follow-up.  Problem  List Items Addressed This Visit       Other   Chronic anxiety    Well-controlled. History of panic attacks in the past Asymptomatic today      Relevant Orders   CBC with Differential/Platelet   Comprehensive metabolic panel   Hemoglobin A1c   Lipid panel   TSH   Iron deficiency anemia - Primary    Much improved.  Asymptomatic. Lab Results  Component Value Date   WBC 7.0 09/24/2022   HGB 10.0 (L) 09/24/2022   HCT 32.2 (L) 09/24/2022   MCV 64.6 Repeated and verified X2. (L) 09/24/2022   PLT 264.0 09/24/2022  Secondary to GI bleed Scheduled to follow-up with  GI doctor Continues omeprazole 40 mg daily.       Relevant Orders   CBC with Differential/Platelet   Comprehensive metabolic panel   Hemoglobin A1c   Lipid panel   TSH   Other Visit Diagnoses     Encounter to establish care          Patient Instructions  Mantenimiento de la salud en Malcolm Maintenance, Male Adoptar un estilo de vida saludable y recibir atencin preventiva son importantes para promover la salud y Musician. Consulte al mdico sobre: El esquema adecuado para hacerse pruebas y exmenes peridicos. Cosas que puede hacer por su cuenta para prevenir enfermedades y [place] sano. Qu debo saber sobre la dieta, el peso y el ejercicio? Consuma una dieta saludable  Consuma una dieta que incluya muchas verduras, frutas, productos lcteos con bajo contenido de [place] y Advertising account planner. No consuma muchos alimentos ricos en grasas slidas, azcares agregados o sodio. Mantenga un peso saludable El ndice de masa muscular [facility]) es una medida que puede utilizarse para identificar posibles problemas de [place]. Proporciona una estimacin de la grasa corporal basndose en el peso y la altura. Su mdico puede ayudarle a Radiation protection practitioner [place] y a Scientist, forensic o Theatre manager un peso saludable. Haga ejercicio con regularidad Haga ejercicio con regularidad. Esta es una de las prcticas ms importantes que puede  hacer por su salud. La [employer] de los adultos deben seguir estas pautas: Optometrist, al menos, 150 minutos de actividad fsica por semana. El ejercicio debe aumentar la frecuencia cardaca y Nature conservation officer transpirar (ejercicio de intensidad moderada). Hacer ejercicios de fortalecimiento por lo [employer] por semana. Agregue esto a su plan de ejercicio de intensidad moderada. Pase menos tiempo sentado. Incluso la actividad fsica ligera puede ser beneficiosa. Controle sus niveles de colesterol y lpidos en la sangre Comience a realizarse anlisis de lpidos y Research officer, trade union en la sangre a los 56 aos y luego reptalos cada 5 aos. Es posible que Automotive engineer los niveles de colesterol con mayor frecuencia si: Sus niveles de lpidos y colesterol son altos. Es mayor de 20 aos. Presenta un alto riesgo de padecer enfermedades cardacas. Qu debo saber sobre las pruebas de deteccin del cncer? Muchos tipos de cncer pueden detectarse de manera temprana y, a menudo, pueden prevenirse. Segn su historia clnica y sus antecedentes familiares, es posible que deba realizarse pruebas de deteccin del cncer en diferentes edades. Esto puede incluir pruebas de deteccin de lo siguiente: Surveyor, minerals. Cncer de prstata. Cncer de piel. Cncer de pulmn. Qu debo saber sobre la enfermedad cardaca, la diabetes y la hipertensin arterial? Presin arterial y enfermedad cardaca La hipertensin arterial causa enfermedades cardacas y [place] el riesgo de accidente cerebrovascular. Es ms probable que esto se manifieste en las personas que tienen lecturas de presin arterial alta o tienen sobrepeso. Hable con el mdico sobre sus valores de presin arterial deseados. Hgase controlar la presin arterial: Cada 3 a 5 aos si tiene entre 18 y 83 aos. Todos los aos si es mayor de 40 aos. Si tiene entre 62 y 108 aos y es fumador o Insurance account manager, pregntele al mdico si debe realizarse una prueba de deteccin  de aneurisma artico abdominal (AAA) por nica vez. Diabetes Realcese exmenes de deteccin de la diabetes con regularidad. Este anlisis revisa el nivel de azcar en la sangre en [place]. Hgase las pruebas de deteccin: Cada tres aos despus de los 52 aos de edad si tiene un peso normal y un bajo riesgo de  padecer diabetes. Con ms frecuencia y a partir de [place] edad inferior si tiene sobrepeso o un alto riesgo de padecer diabetes. Qu debo saber sobre la prevencin de infecciones? Hepatitis B Si tiene un riesgo ms alto de contraer hepatitis B, debe someterse a un examen de deteccin de este virus. Hable con el mdico para averiguar si tiene riesgo de contraer la infeccin por hepatitis B. Hepatitis C Se recomienda un anlisis de [place] para: Todos los que nacieron entre 1945 y [phone]. Todas las personas que tengan un riesgo de haber contrado hepatitis C. Enfermedades de transmisin sexual (ETS) Debe realizarse pruebas de deteccin de ITS todos los aos, incluidas la gonorrea y la clamidia, si: Es sexualmente activo y es menor de 86 aos. Es mayor de 29 aos, y Investment banker, operational informa que corre riesgo de tener este tipo de infecciones. La actividad sexual ha cambiado desde que le hicieron la ltima prueba de deteccin y tiene un riesgo mayor de Best boy clamidia o Radio broadcast assistant. Pregntele al mdico si usted tiene riesgo. Pregntele al mdico si usted tiene un alto riesgo de Museum/gallery curator VIH. El mdico tambin puede recomendarle un medicamento recetado para ayudar a evitar la infeccin por el VIH. Si elige tomar medicamentos para prevenir el VIH, primero debe [employer] de deteccin del VIH. Luego debe hacerse anlisis cada 3 meses mientras est tomando los medicamentos. Siga estas indicaciones en su casa: Consumo de alcohol No beba alcohol si el mdico se lo prohbe. Si bebe alcohol: Limite la cantidad que consume de 0 a 2 bebidas por da. Sepa cunta cantidad de alcohol hay en las bebidas que  toma. En los [place], una medida equivale a una botella de cerveza de 12 oz (355 ml), un vaso de vino de 5 oz (148 ml) o un vaso de una bebida alcohlica de alta graduacin de 1 oz (44 ml). Estilo de vida No consuma ningn producto que contenga nicotina o tabaco. Estos productos incluyen cigarrillos, tabaco para Higher education careers adviser y aparatos de vapeo, como los Psychologist, sport and exercise. Si necesita ayuda para dejar de consumir estos productos, consulte al mdico. No consuma drogas. No comparta agujas. Solicite ayuda a su mdico si necesita apoyo o informacin para abandonar las drogas. Indicaciones generales Realcese los estudios de rutina de la salud, dentales y de Public librarian. [place]. Infrmele a su mdico si: Se siente deprimido con frecuencia. Alguna vez ha sido vctima de [place] o no se siente seguro en su casa. Resumen Adoptar un estilo de vida saludable y recibir atencin preventiva son importantes para promover la salud y Musician. Siga las instrucciones del mdico acerca de una dieta saludable, el ejercicio y la realizacin de pruebas o exmenes para Engineer, building services. Siga las instrucciones del mdico con respecto al control del colesterol y la presin arterial. Esta informacin no tiene Marine scientist el consejo del mdico. Asegrese de hacerle al mdico cualquier pregunta que tenga. Document Revised: 11/27/2020 Document Reviewed: 11/27/2020 Elsevier Patient Education  [place], MD [facility]

## 2022-10-06 NOTE — Assessment & Plan Note (Addendum)
Much improved.  Asymptomatic. Lab Results  Component Value Date   WBC 7.0 09/24/2022   HGB 10.0 (L) 09/24/2022   HCT 32.2 (L) 09/24/2022   MCV 64.6 Repeated and verified X2. (L) 09/24/2022   PLT 264.0 09/24/2022  Secondary to GI bleed Scheduled to follow-up with GI doctor Continues omeprazole 40 mg daily.

## 2022-11-25 ENCOUNTER — Inpatient Hospital Stay (HOSPITAL_COMMUNITY)
Admission: EM | Admit: 2022-11-25 | Discharge: 2022-11-28 | DRG: 379 | Disposition: A | Discharge status: Home / Self Care | Payer: 59 | Attending: Internal Medicine | Admitting: Internal Medicine

## 2022-11-25 ENCOUNTER — Other Ambulatory Visit: Payer: Self-pay

## 2022-11-25 ENCOUNTER — Encounter (HOSPITAL_COMMUNITY): Payer: Self-pay

## 2022-11-25 DIAGNOSIS — F419 Anxiety disorder, unspecified: Secondary | ICD-10-CM | POA: Diagnosis present

## 2022-11-25 DIAGNOSIS — F1010 Alcohol abuse, uncomplicated: Secondary | ICD-10-CM | POA: Diagnosis present

## 2022-11-25 DIAGNOSIS — K648 Other hemorrhoids: Secondary | ICD-10-CM | POA: Diagnosis present

## 2022-11-25 DIAGNOSIS — Z8249 Family history of ischemic heart disease and other diseases of the circulatory system: Secondary | ICD-10-CM

## 2022-11-25 DIAGNOSIS — D500 Iron deficiency anemia secondary to blood loss (chronic): Secondary | ICD-10-CM | POA: Diagnosis present

## 2022-11-25 DIAGNOSIS — K317 Polyp of stomach and duodenum: Secondary | ICD-10-CM | POA: Diagnosis present

## 2022-11-25 DIAGNOSIS — R1310 Dysphagia, unspecified: Secondary | ICD-10-CM | POA: Diagnosis present

## 2022-11-25 DIAGNOSIS — Z79899 Other long term (current) drug therapy: Secondary | ICD-10-CM

## 2022-11-25 DIAGNOSIS — K5731 Diverticulosis of large intestine without perforation or abscess with bleeding: Secondary | ICD-10-CM | POA: Diagnosis not present

## 2022-11-25 DIAGNOSIS — Z833 Family history of diabetes mellitus: Secondary | ICD-10-CM

## 2022-11-25 DIAGNOSIS — K922 Gastrointestinal hemorrhage, unspecified: Principal | ICD-10-CM | POA: Diagnosis present

## 2022-11-25 LAB — COMPREHENSIVE METABOLIC PANEL
ALT: 20 U/L (ref 0–44)
AST: 19 U/L (ref 15–41)
Albumin: 3.7 g/dL (ref 3.5–5.0)
Alkaline Phosphatase: 101 U/L (ref 38–126)
Anion gap: 8 (ref 5–15)
BUN: 15 mg/dL (ref 6–20)
CO2: 26 mmol/L (ref 22–32)
Calcium: 8.5 mg/dL — ABNORMAL LOW (ref 8.9–10.3)
Chloride: 104 mmol/L (ref 98–111)
Creatinine, Ser: 0.76 mg/dL (ref 0.61–1.24)
GFR, Estimated: >60 mL/min (ref >60)
Glucose, Bld: 119 mg/dL — ABNORMAL HIGH (ref 70–99)
Potassium: 4 mmol/L (ref 3.5–5.1)
Sodium: 138 mmol/L (ref 135–145)
Total Bilirubin: 0.5 mg/dL (ref 0.3–1.2)
Total Protein: 7.2 g/dL (ref 6.5–8.1)

## 2022-11-25 LAB — CBC
HCT: 28.4 % — ABNORMAL LOW (ref 39.0–52.0)
Hemoglobin: 8 g/dL — ABNORMAL LOW (ref 13.0–17.0)
MCH: 19.3 pg — ABNORMAL LOW (ref 26.0–34.0)
MCHC: 28.2 g/dL — ABNORMAL LOW (ref 30.0–36.0)
MCV: 68.4 fL — ABNORMAL LOW (ref 80.0–100.0)
Platelets: 343 10*3/uL (ref 150–400)
RBC: 4.15 MIL/uL — ABNORMAL LOW (ref 4.22–5.81)
RDW: 16.2 % — ABNORMAL HIGH (ref 11.5–15.5)
WBC: 4.5 10*3/uL (ref 4.0–10.5)
nRBC: 0 % (ref 0.0–0.2)

## 2022-11-25 LAB — TYPE AND SCREEN
ABO/RH(D): O POS
Antibody Screen: NEGATIVE

## 2022-11-25 LAB — IRON AND TIBC
Iron: 14 ug/dL — ABNORMAL LOW (ref 45–182)
Saturation Ratios: 3 % — ABNORMAL LOW (ref 17.9–39.5)
TIBC: 487 ug/dL — ABNORMAL HIGH (ref 250–450)
UIBC: 473 ug/dL

## 2022-11-25 LAB — FOLATE: Folate: 24.3 ng/mL (ref >5.9)

## 2022-11-25 LAB — POC OCCULT BLOOD, ED: Fecal Occult Bld: NEGATIVE

## 2022-11-25 LAB — FERRITIN: Ferritin: 2 ng/mL — ABNORMAL LOW (ref 24–336)

## 2022-11-25 LAB — HEMOGLOBIN AND HEMATOCRIT, BLOOD
HCT: 25.8 % — ABNORMAL LOW (ref 39.0–52.0)
Hemoglobin: 7.4 g/dL — ABNORMAL LOW (ref 13.0–17.0)

## 2022-11-25 LAB — VITAMIN B12: Vitamin B-12: 542 pg/mL (ref 180–914)

## 2022-11-25 LAB — RETICULOCYTES
Immature Retic Fract: 22.9 % — ABNORMAL HIGH (ref 2.3–15.9)
RBC.: 3.91 MIL/uL — ABNORMAL LOW (ref 4.22–5.81)
Retic Count, Absolute: 44.6 10*3/uL (ref 19.0–186.0)
Retic Ct Pct: 1.1 % (ref 0.4–3.1)

## 2022-11-25 LAB — ETHANOL: Alcohol, Ethyl (B): <10 mg/dL (ref <10)

## 2022-11-25 LAB — ABO/RH: ABO/RH(D): O POS

## 2022-11-25 MED ORDER — ACETAMINOPHEN 325 MG PO TABS: 650.0000 mg | ORAL_TABLET | Freq: Four times a day (QID) | ORAL | Status: DC | PRN

## 2022-11-25 MED ORDER — ACETAMINOPHEN 650 MG RE SUPP: 650.0000 mg | Freq: Four times a day (QID) | RECTAL | Status: DC | PRN

## 2022-11-25 MED ORDER — FOLIC ACID 1 MG PO TABS
1.0000 mg | ORAL_TABLET | Freq: Every day | ORAL | Status: DC
  Administered 2022-11-25 – 2022-11-28 (×4): 1 mg via ORAL
  Filled 2022-11-25 (×4): qty 1

## 2022-11-25 MED ORDER — PANTOPRAZOLE INFUSION (NEW) - SIMPLE MED
8.0000 mg/h | INTRAVENOUS | Status: DC
  Administered 2022-11-25 – 2022-11-26 (×3): 8 mg/h via INTRAVENOUS
  Filled 2022-11-25: qty 100
  Filled 2022-11-25: qty 80
  Filled 2022-11-25: qty 100
  Filled 2022-11-25: qty 80
  Filled 2022-11-25 (×2): qty 100
  Filled 2022-11-25: qty 80

## 2022-11-25 MED ORDER — ALBUTEROL SULFATE (2.5 MG/3ML) 0.083% IN NEBU: 2.5000 mg | INHALATION_SOLUTION | RESPIRATORY_TRACT | Status: DC | PRN

## 2022-11-25 MED ORDER — LORAZEPAM 1 MG PO TABS: 1.0000 mg | ORAL_TABLET | ORAL | Status: DC | PRN

## 2022-11-25 MED ORDER — LORAZEPAM 2 MG/ML IJ SOLN: 1.0000 mg | INTRAMUSCULAR | Status: DC | PRN

## 2022-11-25 MED ORDER — SODIUM CHLORIDE 0.9 % IV SOLN: INTRAVENOUS | Status: AC

## 2022-11-25 MED ORDER — ONDANSETRON HCL 4 MG PO TABS: 4.0000 mg | ORAL_TABLET | Freq: Four times a day (QID) | ORAL | Status: DC | PRN

## 2022-11-25 MED ORDER — THIAMINE MONONITRATE 100 MG PO TABS
100.0000 mg | ORAL_TABLET | Freq: Every day | ORAL | Status: DC
  Administered 2022-11-25 – 2022-11-28 (×4): 100 mg via ORAL
  Filled 2022-11-25 (×4): qty 1

## 2022-11-25 MED ORDER — ONDANSETRON HCL 4 MG/2ML IJ SOLN: 4.0000 mg | Freq: Four times a day (QID) | INTRAMUSCULAR | Status: DC | PRN

## 2022-11-25 MED ORDER — ADULT MULTIVITAMIN W/MINERALS CH
1.0000 | ORAL_TABLET | Freq: Every day | ORAL | Status: DC
  Administered 2022-11-25 – 2022-11-28 (×4): 1 via ORAL
  Filled 2022-11-25 (×4): qty 1

## 2022-11-25 MED ORDER — THIAMINE HCL 100 MG/ML IJ SOLN: 100.0000 mg | Freq: Every day | INTRAMUSCULAR | Status: DC

## 2022-11-25 MED ORDER — SODIUM CHLORIDE 0.9 % IV BOLUS
1000.0000 mL | Freq: Once | INTRAVENOUS | Status: AC
  Administered 2022-11-25: 1000 mL via INTRAVENOUS

## 2022-11-25 MED ORDER — POLYETHYLENE GLYCOL 3350 17 G PO PACK: 17.0000 g | PACK | Freq: Every day | ORAL | Status: DC | PRN

## 2022-11-25 MED ORDER — PANTOPRAZOLE 80MG IVPB - SIMPLE MED
80.0000 mg | Freq: Once | INTRAVENOUS | Status: AC
  Administered 2022-11-25: 80 mg via INTRAVENOUS
  Filled 2022-11-25: qty 80

## 2022-11-25 NOTE — ED Provider Notes (Signed)
[facility] AT [facility] Provider Note   CSN: [id number] Arrival date & time: 11/25/22  1440     History  Chief Complaint  Patient presents with   Rectal Bleeding    [name] is a 57 y.o. male history of alcohol abuse, internal hemorrhoids, here presenting with rectal bleeding.  Patient states that he was recently admitted for rectal bleeding and had extensive workup.  He had a CT abdomen pelvis that was unremarkable and had colonoscopy that showed diverticulosis and internal hemorrhoids.  He also had nonbleeding stomach polyps at that time.  Patient states that for the last 10 days, he has been having rectal bleeding at least once a day.  He states that it is bright red blood around his stool.  He states that his stool is not black at that time.  Patient states that he had 2 episodes today so came in for further evaluation.  He has been followed up with [facility] since his hospitalization.  He states that his hemoglobin usually is around 10.  The history is provided by the patient.       Home Medications Prior to Admission medications   Medication Sig Start Date End Date Taking? Authorizing Provider  Omeprazole 20 MG TBEC Take 2 tablets (40 mg total) by mouth daily. 08/21/22   Lanae Boast, MD      Allergies    Patient has no known allergies.    Review of Systems   Review of Systems  Gastrointestinal:  Positive for blood in stool.  All other systems reviewed and are negative.   Physical Exam Updated Vital Signs BP 114/75   Pulse 89   Temp 98.5 F (36.9 C)   Resp 20   Wt 78 kg   SpO2 100%   BMI 28.62 kg/m  Physical Exam Vitals and nursing note reviewed.  Constitutional:      Appearance: Normal appearance.  HENT:     Head: Normocephalic.     Nose: Nose normal.     Mouth/Throat:     Mouth: Mucous membranes are moist.  Eyes:     Extraocular Movements: Extraocular movements intact.     Pupils: Pupils are equal, round,  and reactive to light.     Comments: Conjunctiva slightly pale  Cardiovascular:     Rate and Rhythm: Normal rate and regular rhythm.     Pulses: Normal pulses.     Heart sounds: Normal heart sounds.  Pulmonary:     Effort: Pulmonary effort is normal.     Breath sounds: Normal breath sounds.  Abdominal:     General: Abdomen is flat.     Palpations: Abdomen is soft.  Genitourinary:    Comments: ?  Internal hemorrhoid but no active bleeding.   Musculoskeletal:     Cervical back: Normal range of motion and neck supple.  Skin:    General: Skin is warm.     Capillary Refill: Capillary refill takes less than 2 seconds.  Neurological:     General: No focal deficit present.     Mental Status: He is alert and oriented to person, place, and time.  Psychiatric:        Mood and Affect: Mood normal.        Behavior: Behavior normal.     ED Results / Procedures / Treatments   Labs (all labs ordered are listed, but only abnormal results are displayed) Labs Reviewed  COMPREHENSIVE METABOLIC PANEL - Abnormal; Notable for the  following components:      Result Value   Glucose, Bld 119 (*)    Calcium 8.5 (*)    All other components within normal limits  CBC - Abnormal; Notable for the following components:   RBC 4.15 (*)    Hemoglobin 8.0 (*)    HCT 28.4 (*)    MCV 68.4 (*)    MCH 19.3 (*)    MCHC 28.2 (*)    RDW 16.2 (*)    All other components within normal limits  ETHANOL  POC OCCULT BLOOD, ED  TYPE AND SCREEN  ABO/RH    EKG None  Radiology No results found.  Procedures Procedures    Medications Ordered in ED Medications  sodium chloride 0.9 % bolus 1,000 mL (has no administration in time range)  pantoprazole (PROTONIX) 80 mg /NS 100 mL IVPB (has no administration in time range)  pantoprozole (PROTONIX) 80 mg /NS 100 mL infusion (has no administration in time range)    ED Course/ Medical Decision Making/ A&P                             Medical Decision  Making GIOVANNE LINNANE is a 57 y.o. male here presenting with rectal bleeding.  Patient has been having bright red blood per rectum for about 10 days now.  Patient also continues to drink alcohol.  His recent endoscopy showed stomach polyps and no varices.  Patient does have internal hemorrhoids and diverticulosis on his colonoscopy.  Concern for bleeding from polyps versus diverticulosis versus internal hemorrhoids.  Plan to check CBC and CMP and consult [facility]  4:59 PM Patient's hemoglobin is 8.  His baseline is around 10.  Given his alcohol use and also history of stomach polyps, will start patient on Protonix.  I consulted [place] [facility] to see patient.   5:48 PM Talked to Dr. Leonides Schanz from [facility]. She will see patient tomorrow.  Hospitalist to admit for [facility] bleed.   Problems Addressed: Gastrointestinal hemorrhage, unspecified gastrointestinal hemorrhage type: acute illness or injury  Amount and/or Complexity of Data Reviewed Labs: ordered. Decision-making details documented in ED Course.  Risk Decision regarding hospitalization.    Final Clinical Impression(s) / ED Diagnoses Final diagnoses:  None    Rx / DC Orders ED Discharge Orders     None         Charlynne Pander, MD 11/25/22 1750

## 2022-11-25 NOTE — ED Triage Notes (Signed)
C/o rectal bleeding x10 days.  Pt denies abd pain just states "occasionally discomfort" Pt reports daily beer drinker 4/day transfusion in February for the same.  Denies cp/sob/dizziness/fatigue No blood thinner usage noted

## 2022-11-25 NOTE — ED Notes (Signed)
ED TO INPATIENT HANDOFF REPORT  Name/Age/Gender Troy Stevenson 57 y.o. male  Code Status    Code Status Orders  (From admission, onward)           Start     Ordered   11/25/22 1748  Full code  Continuous       Question:  By:  Answer:  Consent: discussion documented in EHR   11/25/22 1748           Code Status History     Date Active Date Inactive Code Status Order ID Comments User Context   08/20/2022 0457 08/23/2022 1959 Full Code [id number]  Hillary Bow, DO ED       Home/SNF/Other Home  Chief Complaint GI bleed [K92.2]  Level of Care/Admitting Diagnosis ED Disposition     ED Disposition  Admit   Condition  --   Comment  Hospital Area: [facility] [[id number]]  Level of Care: Telemetry [5]  Admit to tele based on following criteria: Complex arrhythmia (Bradycardia/Tachycardia)  Admit to tele based on following criteria: Other see comments  Comments: GI bleed  May place patient in observation at [facility] or [facility] if equivalent level of care is available:: No  Covid Evaluation: Asymptomatic - no recent exposure (last 10 days) testing not required  Diagnosis: GI bleed [829562]  Admitting Physician: [place], ERIC J [[id number]]  Attending Physician: [place], ERIC J [[id number]]          Medical History Past Medical History:  Diagnosis Date   Anxiety    Hepatitis     Allergies No Known Allergies  IV Location/Drains/Wounds Patient Lines/Drains/Airways Status     Active Line/Drains/Airways     Name Placement date Placement time Site Days   Peripheral IV 11/25/22 20 G 1" Right Antecubital 11/25/22  1753  Antecubital  less than 1            Labs/Imaging Results for orders placed or performed during the hospital encounter of 11/25/22 (from the past 48 hour(s))  Comprehensive metabolic panel     Status: Abnormal   Collection Time: 11/25/22  3:27 PM  Result Value Ref Range   Sodium 138 135 - 145 mmol/L    Potassium 4.0 3.5 - 5.1 mmol/L   Chloride 104 98 - 111 mmol/L   CO2 26 22 - 32 mmol/L   Glucose, Bld 119 (H) 70 - 99 mg/dL    Comment: Glucose reference range applies only to samples taken after fasting for at least 8 hours.   BUN 15 6 - 20 mg/dL   Creatinine, Ser 5.28 0.61 - 1.24 mg/dL   Calcium 8.5 (L) 8.9 - 10.3 mg/dL   Total Protein 7.2 6.5 - 8.1 g/dL   Albumin 3.7 3.5 - 5.0 g/dL   AST 19 15 - 41 U/L   ALT 20 0 - 44 U/L   Alkaline Phosphatase 101 38 - 126 U/L   Total Bilirubin 0.5 0.3 - 1.2 mg/dL   GFR, Estimated >41 >32 mL/min    Comment: (NOTE) Calculated using the CKD-EPI Creatinine Equation (2021)    Anion gap 8 5 - 15    Comment: Performed at [facility], [address] [place], [place] [address]  CBC     Status: Abnormal   Collection Time: 11/25/22  3:27 PM  Result Value Ref Range   WBC 4.5 4.0 - 10.5 K/uL   RBC 4.15 (L) 4.22 - 5.81 MIL/uL   Hemoglobin 8.0 (L) 13.0 -  17.0 g/dL    Comment: Reticulocyte Hemoglobin testing may be clinically indicated, consider ordering this additional test [id number]    HCT 28.4 (L) 39.0 - 52.0 %   MCV 68.4 (L) 80.0 - 100.0 fL   MCH 19.3 (L) 26.0 - 34.0 pg   MCHC 28.2 (L) 30.0 - 36.0 g/dL   RDW 54.0 (H) 98.1 - 19.1 %   Platelets 343 150 - 400 K/uL   nRBC 0.0 0.0 - 0.2 %    Comment: Performed at [facility], [address] [place], [place] [address]  Type and screen [facility]     Status: None   Collection Time: 11/25/22  3:27 PM  Result Value Ref Range   ABO/RH(D) O POS    Antibody Screen NEG    Sample Expiration      11/28/2022,2359 Performed at [facility], [address] [place], [place] [address]   POC occult blood, ED     Status: None   Collection Time: 11/25/22  5:00 PM  Result Value Ref Range   Fecal Occult Bld NEGATIVE NEGATIVE   No results found.  Pending Labs Unresulted Labs (From admission, onward)     Start     Ordered    11/25/22 1646  Ethanol  Once,   URGENT        11/25/22 1645   11/25/22 1635  ABO/Rh  Once,   R        11/25/22 1635   Signed and Held  Hemoglobin and hematocrit, blood  Now then every 6 hours,   R      Signed and Held   Signed and Held  CBC  Tomorrow morning,   R        Signed and Held   Signed and Held  Basic metabolic panel  Daily,   R      Signed and Held   Signed and Held  Magnesium  Tomorrow morning,   R        Signed and Held   Signed and Held  Vitamin B12  (Anemia Panel (PNL))  Add-on,   R        Signed and Held   Signed and Held  Folate  (Anemia Panel (PNL))  Add-on,   R        Signed and Held   Signed and Held  Iron and TIBC  (Anemia Panel (PNL))  Add-on,   R        Signed and Held   Signed and Held  Ferritin  (Anemia Panel (PNL))  Add-on,   R        Signed and Held   Signed and Held  Reticulocytes  (Anemia Panel (PNL))  Add-on,   R        Signed and Held            Vitals/Pain Today's Vitals   11/25/22 1451 11/25/22 1458 11/25/22 1500  BP: 114/75    Pulse: 89    Resp: 20    Temp: 98.5 F (36.9 C)    SpO2: 100%    Weight:   78 kg  PainSc:  0-No pain     Isolation Precautions No active isolations  Medications Medications  pantoprozole (PROTONIX) 80 mg /NS 100 mL infusion (8 mg/hr Intravenous New Bag/Given 11/25/22 1829)  sodium chloride 0.9 % bolus 1,000 mL (1,000 mLs Intravenous New Bag/Given 11/25/22 1754)  pantoprazole (PROTONIX) 80 mg /NS 100 mL IVPB (0 mg Intravenous  Stopped 11/25/22 1827)    Mobility walks

## 2022-11-25 NOTE — H&P (Signed)
History and Physical    Patient: Troy Stevenson:[id number] DOB: 07-06-1966 DOA: 11/25/2022 DOS: the patient was seen and examined on 11/25/2022 PCP: Patient, No Pcp Per  Patient coming from: Home  Chief Complaint: Bright red blood per rectum Chief Complaint  Patient presents with   Rectal Bleeding   HPI: Troy Stevenson is a 57 y.o. male with medical history significant of EtOH abuse, colon polyps, internal hemorrhoids, diverticulosis, iron deficiency anemia who presented to [facility] ED on 5/22 with complaints of bright red blood per rectum over the last 10 days.  Otherwise patient asymptomatic, denies dizziness, no headache, no fatigue, no abdominal pain, no fever/chills/night sweats, no focal weakness, no fatigue, no paresthesias.  Denies use of anticoagulants/antiplatelets.  In the ED, temperature 98.5 F, HR 89, RR 20, BP 114/75, SpO2 100% on room air.  WBC 4.5, hemoglobin 8.0 (9.8 on 10/06/2022), platelet count 343.  Sodium 138, potassium 4.0, chloride 104, glucose 119, BUN 15, creatinine 0.76.  AST 19, ALT 20, total bilirubin 0.5.  FOBT negative.  GI was consulted, who recommended admission and will consult on the patient in a.m.  [facility] consulted for admission for further evaluation and management of concern for lower GI bleed.   Review of Systems: As mentioned in the history of present illness. All other systems reviewed and are negative. Past Medical History:  Diagnosis Date   Anxiety    Hepatitis    Past Surgical History:  Procedure Laterality Date   BIOPSY  08/21/2022   Procedure: BIOPSY;  Surgeon: Jenel Lucks, MD;  Location: [facility] ENDOSCOPY;  Service: Gastroenterology;;   COLONOSCOPY WITH PROPOFOL N/A 08/21/2022   Procedure: COLONOSCOPY WITH PROPOFOL;  Surgeon: Jenel Lucks, MD;  Location: [facility] ENDOSCOPY;  Service: Gastroenterology;  Laterality: N/A;   ESOPHAGOGASTRODUODENOSCOPY (EGD) WITH PROPOFOL N/A 08/21/2022   Procedure:  ESOPHAGOGASTRODUODENOSCOPY (EGD) WITH PROPOFOL;  Surgeon: Jenel Lucks, MD;  Location: [facility] ENDOSCOPY;  Service: Gastroenterology;  Laterality: N/A;   POLYPECTOMY  08/21/2022   Procedure: POLYPECTOMY;  Surgeon: Jenel Lucks, MD;  Location: [facility] ENDOSCOPY;  Service: Gastroenterology;;   Social History:  reports that he has never smoked. He has never used smokeless tobacco. He reports current alcohol use. He reports that he does not use drugs.  No Known Allergies  Family History  Problem Relation Age of Onset   Hypertension Father    Diabetes Father    Colon cancer Neg Hx    Rectal cancer Neg Hx     Prior to Admission medications   Medication Sig Start Date End Date Taking? Authorizing Provider  Omeprazole 20 MG TBEC Take 2 tablets (40 mg total) by mouth daily. 08/21/22   Lanae Boast, MD    Physical Exam: Vitals:   11/25/22 1451 11/25/22 1500  BP: 114/75   Pulse: 89   Resp: 20   Temp: 98.5 F (36.9 C)   SpO2: 100%   Weight:  78 kg   Physical Exam GEN: 57 yo male in NAD, alert and oriented x 3, wd/wn HEENT: NCAT, PERRL, EOMI, sclera clear, MMM PULM: CTAB w/o wheezes/crackles, normal respiratory effort CV: RRR w/o M/G/R GI: abd soft, NTND, NABS, no R/G/M MSK: no peripheral edema, muscle strength globally intact 5/5 bilateral upper/lower extremities NEURO: CN II-XII intact, no focal deficits, sensation to light touch intact PSYCH: normal mood/affect Integumentary: dry/intact, no rashes or wounds    Assessment and Plan:  Lower GI bleed Patient presenting to ED with bright red blood per rectum over  the last 10 days.  Hemoglobin 8.0 on admission, most recent 9.8 on 10/06/2022.  Was previously hospitalized in February 2024 underwent EGD/colonoscopy with findings of multiple semipedunculated polyps with no bleeding in the gastric fundus/gastric body, colonic polyps in which she underwent polypectomy, internal hemorrhoids and diverticulosis.  Denies melena.  FOBT negative.   Suspect etiology of lower GI bleed related to diverticulosis versus internal hemorrhoids. -- Admit to telemetry -- GI consulted and will see in a.m. -- Continue to trend hemoglobin every 6 hours, transfuse for hemoglobin less than 7.0 -- Check anemia panel -- Currently not actively bleeding, unless significant bleeding would not benefit from CT angiogram GI study or nuclear medicine bleeding scan at this time -- Protonix drip -- Clear liquid diet, n.p.o. after midnight  EtOH use disorder -- CIWA protocol with symptom triggered Ativan    Advance Care Planning:   Code Status: Full Code   Consults: [facility]   Family Communication: no family present at bedside  Severity of Illness: The appropriate patient status for this patient is OBSERVATION. Observation status is judged to be reasonable and necessary in order to provide the required intensity of service to ensure the patient's safety. The patient's presenting symptoms, physical exam findings, and initial radiographic and laboratory data in the context of their medical condition is felt to place them at decreased risk for further clinical deterioration. Furthermore, it is anticipated that the patient will be medically stable for discharge from the hospital within 2 midnights of admission.   Author: Alvira Philips [place], DO 11/25/2022 5:48 PM  For on call review [email or web address].

## 2022-11-26 ENCOUNTER — Encounter (HOSPITAL_COMMUNITY)
Admission: EM | Disposition: A | Discharge status: Home / Self Care | Payer: Self-pay | Source: Home / Self Care | Attending: Internal Medicine

## 2022-11-26 DIAGNOSIS — K648 Other hemorrhoids: Secondary | ICD-10-CM | POA: Diagnosis not present

## 2022-11-26 DIAGNOSIS — Z8249 Family history of ischemic heart disease and other diseases of the circulatory system: Secondary | ICD-10-CM | POA: Diagnosis not present

## 2022-11-26 DIAGNOSIS — F1010 Alcohol abuse, uncomplicated: Secondary | ICD-10-CM | POA: Diagnosis not present

## 2022-11-26 DIAGNOSIS — R1310 Dysphagia, unspecified: Secondary | ICD-10-CM | POA: Diagnosis not present

## 2022-11-26 DIAGNOSIS — K759 Inflammatory liver disease, unspecified: Secondary | ICD-10-CM | POA: Diagnosis not present

## 2022-11-26 DIAGNOSIS — R1319 Other dysphagia: Secondary | ICD-10-CM

## 2022-11-26 DIAGNOSIS — K921 Melena: Secondary | ICD-10-CM | POA: Diagnosis not present

## 2022-11-26 DIAGNOSIS — Z833 Family history of diabetes mellitus: Secondary | ICD-10-CM | POA: Diagnosis not present

## 2022-11-26 DIAGNOSIS — D509 Iron deficiency anemia, unspecified: Secondary | ICD-10-CM

## 2022-11-26 DIAGNOSIS — K5731 Diverticulosis of large intestine without perforation or abscess with bleeding: Secondary | ICD-10-CM | POA: Diagnosis not present

## 2022-11-26 DIAGNOSIS — K922 Gastrointestinal hemorrhage, unspecified: Secondary | ICD-10-CM | POA: Diagnosis not present

## 2022-11-26 DIAGNOSIS — Z79899 Other long term (current) drug therapy: Secondary | ICD-10-CM | POA: Diagnosis not present

## 2022-11-26 DIAGNOSIS — D500 Iron deficiency anemia secondary to blood loss (chronic): Secondary | ICD-10-CM | POA: Diagnosis not present

## 2022-11-26 DIAGNOSIS — K317 Polyp of stomach and duodenum: Secondary | ICD-10-CM | POA: Diagnosis not present

## 2022-11-26 DIAGNOSIS — F419 Anxiety disorder, unspecified: Secondary | ICD-10-CM | POA: Diagnosis not present

## 2022-11-26 DIAGNOSIS — D649 Anemia, unspecified: Secondary | ICD-10-CM | POA: Diagnosis not present

## 2022-11-26 LAB — BASIC METABOLIC PANEL
Anion gap: 7 (ref 5–15)
BUN: 9 mg/dL (ref 6–20)
CO2: 24 mmol/L (ref 22–32)
Calcium: 8.3 mg/dL — ABNORMAL LOW (ref 8.9–10.3)
Chloride: 105 mmol/L (ref 98–111)
Creatinine, Ser: 0.84 mg/dL (ref 0.61–1.24)
GFR, Estimated: >60 mL/min (ref >60)
Glucose, Bld: 96 mg/dL (ref 70–99)
Potassium: 4.1 mmol/L (ref 3.5–5.1)
Sodium: 136 mmol/L (ref 135–145)

## 2022-11-26 LAB — CBC
HCT: 27.3 % — ABNORMAL LOW (ref 39.0–52.0)
Hemoglobin: 7.7 g/dL — ABNORMAL LOW (ref 13.0–17.0)
MCH: 19.1 pg — ABNORMAL LOW (ref 26.0–34.0)
MCHC: 28.2 g/dL — ABNORMAL LOW (ref 30.0–36.0)
MCV: 67.6 fL — ABNORMAL LOW (ref 80.0–100.0)
Platelets: 324 10*3/uL (ref 150–400)
RBC: 4.04 MIL/uL — ABNORMAL LOW (ref 4.22–5.81)
RDW: 16.1 % — ABNORMAL HIGH (ref 11.5–15.5)
WBC: 4.7 10*3/uL (ref 4.0–10.5)
nRBC: 0 % (ref 0.0–0.2)

## 2022-11-26 LAB — HEMOGLOBIN AND HEMATOCRIT, BLOOD
HCT: 28.1 % — ABNORMAL LOW (ref 39.0–52.0)
HCT: 30.2 % — ABNORMAL LOW (ref 39.0–52.0)
Hemoglobin: 7.9 g/dL — ABNORMAL LOW (ref 13.0–17.0)
Hemoglobin: 8.4 g/dL — ABNORMAL LOW (ref 13.0–17.0)

## 2022-11-26 LAB — MAGNESIUM: Magnesium: 1.8 mg/dL (ref 1.7–2.4)

## 2022-11-26 SURGERY — IMAGING PROCEDURE, GI TRACT, INTRALUMINAL, VIA CAPSULE
Anesthesia: LOCAL

## 2022-11-26 MED ORDER — SIMETHICONE 80 MG PO CHEW
80.0000 mg | CHEWABLE_TABLET | Freq: Four times a day (QID) | ORAL | Status: DC
  Administered 2022-11-26 – 2022-11-28 (×6): 80 mg via ORAL
  Filled 2022-11-26 (×6): qty 1

## 2022-11-26 MED ORDER — SODIUM CHLORIDE 0.9 % IV SOLN
250.0000 mg | Freq: Every day | INTRAVENOUS | Status: AC
  Administered 2022-11-26 – 2022-11-27 (×2): 250 mg via INTRAVENOUS
  Filled 2022-11-26 (×2): qty 20

## 2022-11-26 MED ORDER — POLYETHYLENE GLYCOL 3350 17 GM/SCOOP PO POWD
1.0000 | Freq: Once | ORAL | Status: AC
  Administered 2022-11-26: 255 g via ORAL
  Filled 2022-11-26: qty 255

## 2022-11-26 MED ORDER — SODIUM CHLORIDE 0.9 % IV SOLN: INTRAVENOUS | Status: DC

## 2022-11-26 NOTE — Progress Notes (Addendum)
PROGRESS NOTE    Troy Stevenson  ZSW:[id number] DOB: 02-27-1966 DOA: 11/25/2022 PCP: Georgina Quint, MD    Brief Narrative:   Troy Stevenson is a 57 y.o. male with past medical history significant for EtOH abuse, colon polyps, internal hemorrhoids, diverticulosis, iron deficiency anemia who presented to [facility] ED on 5/22 with complaints of bright red blood per rectum over the last 10 days.  Otherwise patient asymptomatic, denies dizziness, no headache, no fatigue, no abdominal pain, no fever/chills/night sweats, no focal weakness, no fatigue, no paresthesias.  Denies use of anticoagulants/antiplatelets.   In the ED, temperature 98.5 F, HR 89, RR 20, BP 114/75, SpO2 100% on room air.  WBC 4.5, hemoglobin 8.0 (9.8 on 10/06/2022), platelet count 343.  Sodium 138, potassium 4.0, chloride 104, glucose 119, BUN 15, creatinine 0.76.  AST 19, ALT 20, total bilirubin 0.5.  FOBT negative.  GI was consulted, who recommended admission and will consult on the patient in a.m.  [facility] consulted for admission for further evaluation and management of concern for lower GI bleed.  Assessment & Plan:   Lower GI bleed Iron deficiency anemia Patient presenting to ED with bright red blood per rectum over the last 10 days.  Hemoglobin 8.0 on admission, most recent 9.8 on 10/06/2022.  Was previously hospitalized in February 2024 underwent EGD/colonoscopy with findings of multiple semipedunculated polyps with no bleeding in the gastric fundus/gastric body, colonic polyps in which she underwent polypectomy, internal hemorrhoids and diverticulosis.  Denies melena.  FOBT negative.  Suspect etiology of lower GI bleed related to diverticulosis versus internal hemorrhoids.  EMEA panel with iron 14, TIBC 47, ferritin 2, B12 542, folate 24.3. -- [facility] following, appreciate assistance consulted and will see in a.m. -- Hgb 8.0>7.4>7.7 (9.8 on 4/2) -- Check anemia panel -- Currently not actively  bleeding, unless significant bleeding would not benefit from CT angiogram GI study or nuclear medicine bleeding scan at this time -- Protonix drip -- IV iron x 2 -- N.p.o., GI plans capsule endoscopy today -- Continue to monitor H&H every 6 hours and CBC daily; transfuse for hemoglobin less than 7.0 or active bleeding   EtOH use disorder -- CIWA protocol with symptom triggered Ativan     DVT prophylaxis: SCDs Start: 11/25/22 2011    Code Status: Full Code Family Communication: No family present at bedside this morning  Disposition Plan:  Level of care: Telemetry Status is: Observation The patient remains OBS appropriate and will d/c before 2 midnights.    Consultants:  [place] gastro[facility]ology  Procedures:  None  Antimicrobials:  None   Subjective: Patient seen examined bedside, resting comfortably.  Denies any further bowel movements since hospitalization.  Hemoglobin trended down to a low of 7.4, up to 7.7 this morning.  Seen by GI with plans for capsule endoscopy later today.  Patient with no specific complaints, specifically denies headache, no dizziness, no chest pain, no shortness of breath, no abdominal pain.  No acute concerns overnight per nursing staff.  Objective: Vitals:   11/25/22 2021 11/25/22 2355 11/26/22 0429 11/26/22 0820  BP: 116/69 117/72 104/64 109/71  Pulse: 78 75 73 72  Resp: 16 18 18 16   Temp: 98.1 F (36.7 C) 98.2 F (36.8 C) 98.2 F (36.8 C) 98.3 F (36.8 C)  TempSrc: Oral Oral Oral Oral  SpO2: 100% 97% 98% 99%  Weight:        Intake/Output Summary (Last 24 hours) at 11/26/2022 1152 Last data filed at 11/25/2022  1827 Gross per 24 hour  Intake 100.38 ml  Output --  Net 100.38 ml   Filed Weights   11/25/22 1500  Weight: 78 kg    Examination:  Physical Exam: GEN: NAD, alert and oriented x 3, wd/wn HEENT: NCAT, PERRL, EOMI, sclera clear, MMM PULM: CTAB w/o wheezes/crackles, normal respiratory effort CV: RRR w/o M/G/R GI: abd  soft, NTND, NABS, no R/G/M MSK: no peripheral edema, muscle strength globally intact 5/5 bilateral upper/lower extremities NEURO: CN II-XII intact, no focal deficits, sensation to light touch intact PSYCH: normal mood/affect Integumentary: dry/intact, no rashes or wounds    Data Reviewed: I have personally reviewed following labs and imaging studies  CBC: Recent Labs  Lab 11/25/22 1527 11/25/22 2109 11/26/22 0441  WBC 4.5  --  4.7  HGB 8.0* 7.4* 7.7*  HCT 28.4* 25.8* 27.3*  MCV 68.4*  --  67.6*  PLT 343  --  324   Basic Metabolic Panel: Recent Labs  Lab 11/25/22 1527 11/26/22 0441  NA 138 136  K 4.0 4.1  CL 104 105  CO2 26 24  GLUCOSE 119* 96  BUN 15 9  CREATININE 0.76 0.84  CALCIUM 8.5* 8.3*  MG  --  1.8   GFR: Estimated Creatinine Clearance: 95.7 mL/min (by C-G formula based on SCr of 0.84 mg/dL). Liver Function Tests: Recent Labs  Lab 11/25/22 1527  AST 19  ALT 20  ALKPHOS 101  BILITOT 0.5  PROT 7.2  ALBUMIN 3.7   No results for input(s): "LIPASE", "AMYLASE" in the last 168 hours. No results for input(s): "AMMONIA" in the last 168 hours. Coagulation Profile: No results for input(s): "INR", "PROTIME" in the last 168 hours. Cardiac Enzymes: No results for input(s): "CKTOTAL", "CKMB", "CKMBINDEX", "TROPONINI" in the last 168 hours. BNP (last 3 results) No results for input(s): "PROBNP" in the last 8760 hours. HbA1C: No results for input(s): "HGBA1C" in the last 72 hours. CBG: No results for input(s): "GLUCAP" in the last 168 hours. Lipid Profile: No results for input(s): "CHOL", "HDL", "LDLCALC", "TRIG", "CHOLHDL", "LDLDIRECT" in the last 72 hours. Thyroid Function Tests: No results for input(s): "TSH", "T4TOTAL", "FREET4", "T3FREE", "THYROIDAB" in the last 72 hours. Anemia Panel: Recent Labs    11/25/22 2109  VITAMINB12 542  FOLATE 24.3  FERRITIN 2*  TIBC 487*  IRON 14*  RETICCTPCT 1.1   Sepsis Labs: No results for input(s):  "PROCALCITON", "LATICACIDVEN" in the last 168 hours.  No results found for this or any previous visit (from the past 240 hour(s)).       Radiology Studies: No results found.      Scheduled Meds:  folic acid  1 mg Oral Daily   multivitamin with minerals  1 tablet Oral Daily   thiamine  100 mg Oral Daily   Or   thiamine  100 mg Intravenous Daily   Continuous Infusions:  sodium chloride 75 mL/hr at 11/26/22 0000   ferric gluconate (FERRLECIT) IVPB 250 mg (11/26/22 1022)   pantoprazole 8 mg/hr (11/26/22 0601)     LOS: 0 days    Time spent: 51 minutes spent on chart review, discussion with nursing staff, consultants, updating family and interview/physical exam; more than 50% of that time was spent in counseling and/or coordination of care.    Alvira Philips [place], DO [facility] Available via Epic secure chat 7am-7pm After these hours, please refer to coverage provider listed on [email or web address] 11/26/2022, 11:52 AM

## 2022-11-26 NOTE — TOC Initial Note (Signed)
Transition of Care [facility]) - Initial/Assessment Note    Patient Details  Name: Troy Stevenson MRN: [id number] Date of Birth: 1966/02/10  [facility]) CM/SW Contact:    Otelia Santee, LCSW Phone Number: 11/26/2022, 11:55 AM  Clinical Narrative:                 [facility] consulted for SA resources. Met with pt to discuss EtOH use. Pt shares that he typically drink 4-6 beers every evening after work. He reports he drinks water throughout the day and typically does well with staying hydrated. Pt shares that if alcohol use is impacting his physical health he will plan to cut back or stop and does not foresee this being a challenge for him. Pt declines SA resources at this time.   Expected Discharge Plan: Home/Self Care Barriers to Discharge: No Barriers Identified   Patient Goals and CMS Choice Patient states their goals for this hospitalization and ongoing recovery are:: To return home CMS Medicare.gov Compare Post Acute Care list provided to:: Patient Choice offered to / list presented to : Patient [place] ownership interest in [facility].provided to:: Patient    Expected Discharge Plan and Services In-house Referral: Clinical Social Work Discharge Planning Services: NA Post Acute Care Choice: NA Living arrangements for the past 2 months: Single Family Home                 DME Arranged: N/A DME Agency: NA                  Prior Living Arrangements/Services Living arrangements for the past 2 months: Single Family Home Lives with:: Self Patient language and need for interpreter reviewed:: Yes Do you feel safe going back to the place where you live?: Yes      Need for Family Participation in Patient Care: No (Comment) Care giver support system in place?: No (comment)   Criminal Activity/Legal Involvement Pertinent to Current Situation/Hospitalization: No - Comment as needed  Activities of Daily Living Home Assistive Devices/Equipment: None ADL  Screening (condition at time of admission) Patient's cognitive ability adequate to safely complete daily activities?: Yes Is the patient deaf or have difficulty hearing?: No Does the patient have difficulty seeing, even when wearing glasses/contacts?: No Does the patient have difficulty concentrating, remembering, or making decisions?: No Patient able to express need for assistance with ADLs?: No Does the patient have difficulty dressing or bathing?: No Independently performs ADLs?: Yes (appropriate for developmental age) Does the patient have difficulty walking or climbing stairs?: No Weakness of Legs: None Weakness of Arms/Hands: None  Permission Sought/Granted   Permission granted to share information with : No              Emotional Assessment Appearance:: Appears stated age Attitude/Demeanor/Rapport: Engaged Affect (typically observed): Accepting Orientation: : Oriented to Self, Oriented to Place, Oriented to  Time, Oriented to Situation Alcohol / Substance Use: Alcohol Use Psych Involvement: No (comment)  Admission diagnosis:  GI bleed [K92.2] Gastrointestinal hemorrhage, unspecified gastrointestinal hemorrhage type [K92.2] Patient Active Problem List   Diagnosis Date Noted   GI bleed 11/25/2022   Iron deficiency anemia 08/21/2022   Fundic gland polyposis of stomach 08/21/2022   Symptomatic anemia 08/19/2022   Chronic anxiety 02/24/2013   PCP:  Georgina Quint, MD Pharmacy:   [id number] [place], [place] - [address] AT [address] OF [facility] [address] [place] [address] Phone: [phone] Fax: [phone]  Social Determinants of Health (SDOH) Social History: SDOH Screenings   Food Insecurity: No Food Insecurity (11/25/2022)  Housing: Patient Declined (11/25/2022)  Transportation Needs: No Transportation Needs (11/25/2022)  Utilities: Not At Risk (11/25/2022)  Depression (PHQ2-9): Low Risk  (10/06/2022)   Tobacco Use: Low Risk  (11/25/2022)   SDOH Interventions: Housing Interventions: Intervention Not Indicated   Readmission Risk Interventions     No data to display

## 2022-11-26 NOTE — TOC Initial Note (Signed)
Transition of Care [facility]) - Initial/Assessment Note    Patient Details  Name: Troy Stevenson MRN: [id number] Date of Birth: 1965-07-19  Transition of Care [facility]) CM/SW Contact:    Durenda Guthrie, RN Phone Number: 11/26/2022, 8:36 AM  Clinical Narrative:                    Transition of Care [facility]) Department has reviewed patient and no TOC needs have been identified at this time. We will continue to monitor patient advancement through Interdisciplinary progressions and if new patient needs arise, please place a consult.     Patient Goals and CMS Choice            Expected Discharge Plan and Services                                              Prior Living Arrangements/Services                       Activities of Daily Living Home Assistive Devices/Equipment: None ADL Screening (condition at time of admission) Patient's cognitive ability adequate to safely complete daily activities?: Yes Is the patient deaf or have difficulty hearing?: No Does the patient have difficulty seeing, even when wearing glasses/contacts?: No Does the patient have difficulty concentrating, remembering, or making decisions?: No Patient able to express need for assistance with ADLs?: No Does the patient have difficulty dressing or bathing?: No Independently performs ADLs?: Yes (appropriate for developmental age) Does the patient have difficulty walking or climbing stairs?: No Weakness of Legs: None Weakness of Arms/Hands: None  Permission Sought/Granted                  Emotional Assessment              Admission diagnosis:  GI bleed [K92.2] Gastrointestinal hemorrhage, unspecified gastrointestinal hemorrhage type [K92.2] Patient Active Problem List   Diagnosis Date Noted   GI bleed 11/25/2022   Iron deficiency anemia 08/21/2022   Fundic gland polyposis of stomach 08/21/2022   Symptomatic anemia 08/19/2022   Chronic anxiety 02/24/2013   PCP:   Georgina Quint, MD Pharmacy:   [facility] [phone] - [place], [place] - [address] AT [address] OF [facility] [address] [place] [address] Phone: [phone] Fax: [phone]     Social Determinants of Health (SDOH) Social History: SDOH Screenings   Food Insecurity: No Food Insecurity (11/25/2022)  Housing: Patient Declined (11/25/2022)  Transportation Needs: No Transportation Needs (11/25/2022)  Utilities: Not At Risk (11/25/2022)  Depression (PHQ2-9): Low Risk  (10/06/2022)  Tobacco Use: Low Risk  (11/25/2022)   SDOH Interventions: Housing Interventions: Intervention Not Indicated   Readmission Risk Interventions     No data to display

## 2022-11-26 NOTE — H&P (View-Only) (Signed)
     Consultation  Referring Provider:  TR[facility]  Primary Care Physician:  Sagardia, Miguel Jose, MD Primary Gastroenterologist:  Dr. Cunningham       Reason for Consultation:     Painless hematochezia  LOS: 0 days          HPI:   Troy Stevenson is a 56 y.o. male with past medical history significant for EtOH abuse, colon polyps, internal hemorrhoids, diverticulosis, iron deficiency anemia, presents for evaluation of painless hematochezia.  Patient recently seen in hospital in February 2024 for same and was admitted with extensive workup.  EGD 08/21/2022 shows normal esophagus, multiple gastric polyps (fundic gland polyps), normal mucosa in stomach, normal duodenum  Colonoscopy 08/21/2022: Diverticulosis in sigmoid and descending colon.  Nonbleeding internal hemorrhoids.  No specimens collected  Patient also has had rectal bleeding back in 2020 when he underwent a colonoscopy which identified 2 polyps from the transverse colon and tubular friable lung nodule in the proximal anus thought to be a source of bleeding and anemia at that time.  Path report showed polyps as inflammatory.  Patient states he has had intermittent rectal bleeding for many years.  He states it will last for a few days and then resolve.  However, he reports he has had rectal bleeding ongoing for the last 10 days.  He states he has 2 bowel movements a day and with each bowel movement he has seen bright red gelatinous blood in the toilet and on the tissue paper.  Also will have some dripping blood when he is sitting on the commode.  States it looks to be about 10 spoonfuls.  Last episode of bleeding was 5/22 AM prior to coming to the hospital.  Has not had any episodes of bleeding while being admitted.  Denies abdominal pain, nausea, vomiting, weight loss.  Denies melena.  Patient states he is unable to swallow VCE so Dr. Cunningham's plan was to do EGD with VCE placement for further evaluation of IDA.  He would like VCE  during admission.  Upon admission vital signs stable.  Hgb 8.0 (9.8 on 10/06/2022), platelet count 343.  BUN 15, creatinine 0.76.  FOBT negative.   Past Medical History:  Diagnosis Date   Anxiety    Hepatitis     Surgical History:  He  has a past surgical history that includes Esophagogastroduodenoscopy (egd) with propofol (N/A, 08/21/2022); Colonoscopy with propofol (N/A, 08/21/2022); biopsy (08/21/2022); and polypectomy (08/21/2022). Family History:  His family history includes Diabetes in his father; Hypertension in his father. Social History:   reports that he has never smoked. He has never used smokeless tobacco. He reports current alcohol use. He reports that he does not use drugs.  Prior to Admission medications   Medication Sig Start Date End Date Taking? Authorizing Provider  B Complex Vitamins (VITAMIN B COMPLEX) TABS Take 1 tablet by mouth daily.   Yes [provider]  CALCIUM PO Take 1 tablet by mouth daily.   Yes [provider]  Omeprazole 20 MG TBEC Take 2 tablets (40 mg total) by mouth daily. Patient taking differently: Take 20 mg by mouth daily. 08/21/22  Yes Kc, Ramesh, MD    Current Facility-Administered Medications  Medication Dose Route Frequency Provider Last Rate Last Admin   0.9 %  sodium chloride infusion   Intravenous Continuous Austria, Eric J, DO 75 mL/hr at 11/26/22 0000 Continued from Pre-op at 11/26/22 0000   acetaminophen (TYLENOL) tablet 650 mg  650 mg Oral Q6H   PRN Austria, Eric J, DO       Or   acetaminophen (TYLENOL) suppository 650 mg  650 mg Rectal Q6H PRN Austria, Eric J, DO       albuterol (PROVENTIL) (2.5 MG/3ML) 0.083% nebulizer solution 2.5 mg  2.5 mg Nebulization Q2H PRN Austria, Eric J, DO       ferric gluconate (FERRLECIT) 250 mg in sodium chloride 0.9 % 250 mL IVPB  250 mg Intravenous Daily Austria, Eric J, DO       folic acid (FOLVITE) tablet 1 mg  1 mg Oral Daily Austria, Eric J, DO   1 mg at 11/25/22 2152   LORazepam  (ATIVAN) tablet 1-4 mg  1-4 mg Oral Q1H PRN Austria, Eric J, DO       Or   LORazepam (ATIVAN) injection 1-4 mg  1-4 mg Intravenous Q1H PRN Austria, Eric J, DO       multivitamin with minerals tablet 1 tablet  1 tablet Oral Daily Austria, Eric J, DO   1 tablet at 11/25/22 2152   ondansetron (ZOFRAN) tablet 4 mg  4 mg Oral Q6H PRN Austria, Eric J, DO       Or   ondansetron (ZOFRAN) injection 4 mg  4 mg Intravenous Q6H PRN Austria, Eric J, DO       pantoprozole (PROTONIX) 80 mg /NS 100 mL infusion  8 mg/hr Intravenous Continuous Yao, David Hsienta, MD 10 mL/hr at 11/26/22 0601 8 mg/hr at 11/26/22 0601   polyethylene glycol (MIRALAX / GLYCOLAX) packet 17 g  17 g Oral Daily PRN Austria, Eric J, DO       thiamine (VITAMIN B1) tablet 100 mg  100 mg Oral Daily Austria, Eric J, DO   100 mg at 11/25/22 2152   Or   thiamine (VITAMIN B1) injection 100 mg  100 mg Intravenous Daily Austria, Eric J, DO        Allergies as of 11/25/2022   (No Known Allergies)    Review of Systems  Constitutional:  Negative for chills, fever, malaise/fatigue and weight loss.  HENT:  Negative for hearing loss and tinnitus.   Eyes:  Negative for blurred vision and double vision.  Respiratory:  Negative for cough and hemoptysis.   Cardiovascular:  Negative for chest pain and palpitations.  Gastrointestinal:  Positive for blood in stool. Negative for abdominal pain, constipation, diarrhea, heartburn, melena, nausea and vomiting.  Genitourinary:  Negative for dysuria and urgency.  Musculoskeletal:  Negative for myalgias and neck pain.  Skin:  Negative for itching and rash.  Neurological:  Negative for seizures and loss of consciousness.  Psychiatric/Behavioral:  Negative for depression and suicidal ideas.        Physical Exam:  Vital signs in last 24 hours: Temp:  [98.1 F (36.7 C)-98.5 F (36.9 C)] 98.2 F (36.8 C) (05/23 0429) Pulse Rate:  [69-89] 73 (05/23 0429) Resp:  [11-20] 18 (05/23 0429) BP:  (104-119)/(64-75) 104/64 (05/23 0429) SpO2:  [97 %-100 %] 98 % (05/23 0429) Weight:  [78 kg] 78 kg (05/22 1500) Last BM Date : 11/25/22 Last BM recorded by nurses in past 5 days Stool Type: Type 2 (Lump and sausage like) (11/25/2022  8:30 PM)  Physical Exam Constitutional:      Appearance: Normal appearance. He is normal weight. He is not ill-appearing.  HENT:     Nose: Nose normal. No congestion.     Mouth/Throat:     Mouth: Mucous membranes are moist.     Pharynx: Oropharynx   is clear.  Eyes:     General: No scleral icterus.    Extraocular Movements: Extraocular movements intact.  Cardiovascular:     Rate and Rhythm: Normal rate and regular rhythm.  Pulmonary:     Effort: Pulmonary effort is normal. No respiratory distress.  Abdominal:     General: Abdomen is flat. Bowel sounds are normal. There is no distension.     Palpations: Abdomen is soft. There is no mass.     Tenderness: There is no abdominal tenderness. There is no guarding or rebound.     Hernia: No hernia is present.  Musculoskeletal:        General: No swelling. Normal range of motion.     Cervical back: Normal range of motion and neck supple.  Skin:    General: Skin is warm and dry.     Coloration: Skin is not jaundiced.  Neurological:     General: No focal deficit present.     Mental Status: He is oriented to person, place, and time.  Psychiatric:        Mood and Affect: Mood normal.        Behavior: Behavior normal.        Thought Content: Thought content normal.        Judgment: Judgment normal.      LAB RESULTS: Recent Labs    11/25/22 1527 11/25/22 2109 11/26/22 0441  WBC 4.5  --  4.7  HGB 8.0* 7.4* 7.7*  HCT 28.4* 25.8* 27.3*  PLT 343  --  324   BMET Recent Labs    11/25/22 1527 11/26/22 0441  NA 138 136  K 4.0 4.1  CL 104 105  CO2 26 24  GLUCOSE 119* 96  BUN 15 9  CREATININE 0.76 0.84  CALCIUM 8.5* 8.3*   LFT Recent Labs    11/25/22 1527  PROT 7.2  ALBUMIN 3.7  AST 19  ALT  20  ALKPHOS 101  BILITOT 0.5   PT/INR No results for input(s): "LABPROT", "INR" in the last 72 hours.  STUDIES: No results found.    Impression    Lower GI bleed Iron deficiency anemia -Hgb 7.7 (9.8 1-month ago) -BUN 9, creatinine 0.4 -Iron 14, TIBC 487, saturation 3% -Ferritin 2 -Recent EGD/colonoscopy February 2024 showed gastric fundic gland polyps.  Internal hemorrhoids and diverticulosis. DDx includes diverticular bleed versus hemorrhoidal bleed.  However, would have to be a significant hemorrhoidal bleed to cause this level of iron deficiency anemia.  Patient would like further evaluation with VCE, though reports he would be unable to swallow PillCam so original plan was to place it endoscopically. However, he has not attempted to swallow pillcam    Plan   -If recurrent large-volume hematochezia in a short timeframe would recommend CTA.  If positive, IR consultation. -No previous attempts to swallow PillCam in the past.  Would recommend attempting VCE this morning.  If unable to swallow it then can consider placing endoscopically.  However, doing EGD to place PillCam would present higher risk. -Continue daily CBC and transfuse as needed to maintain HGB > 7  -Continue Protonix  Thank you for your kind consultation, we will continue to follow.   Bayley M McMichael  11/26/2022, 7:59 AM    Attending physician's note  I have taken a history, reviewed the chart and examined the patient. I performed a substantive portion of this encounter, including complete performance of at least one of the key components, in conjunction with the APP. I agree   with the APP's note, impression and recommendations.    56-year-old very pleasant male with history of iron deficiency anemia admitted with painless hematochezia.  He recently had EGD and colonoscopy in February 2024 for workup of iron deficiency anemia. No further bleeding since yesterday afternoon  Patient has pill dysphagia, reports  having pill getting stuck in his throat (Centrum multivitamin) few years ago and he had to cough it back up.  He has no difficulty swallowing food. Will plan for EGD with empiric esophageal dilation, possible proximal esophageal web/ring and placement of small bowel video capsule for further evaluation of iron deficiency anemia and painless hematochezia, exclude small bowel source of bleeding  The risks and benefits as well as alternatives of endoscopic procedure(s) have been discussed and reviewed. All questions answered. The patient agrees to proceed.   Clear liquids Bowel prep with MiraLAX and simethicone N.p.o. after 5 AM   The patient was provided an opportunity to ask questions and all were answered. The patient agreed with the plan and demonstrated an understanding of the instructions.  K. Veena Kaidynce Pfister , MD [phone]    

## 2022-11-26 NOTE — Progress Notes (Addendum)
Endo RN went to pt room to have him swallow Pillcam per MD order. Pt states he has difficulty swallowing pills and unable to swallow large pills. RN showed pt the pillcam and he stated "I have to make them cut pills smaller than that into thirds." Pt refused to attempt to swallow Pillcam. MD to be made aware.

## 2022-11-26 NOTE — Progress Notes (Signed)
Medium loose bowel movement with 2 small bots clots noted in stool

## 2022-11-26 NOTE — Consult Note (Addendum)
Consultation  Referring Provider:  [facility]  Primary Care Physician:  Georgina Quint, MD Primary Gastroenterologist:  Dr. Tomasa Rand       Reason for Consultation:     Painless hematochezia  LOS: 0 days          HPI:   Troy Stevenson is a 57 y.o. male with past medical history significant for EtOH abuse, colon polyps, internal hemorrhoids, diverticulosis, iron deficiency anemia, presents for evaluation of painless hematochezia.  Patient recently seen in hospital in February 2024 for same and was admitted with extensive workup.  EGD 08/21/2022 shows normal esophagus, multiple gastric polyps (fundic gland polyps), normal mucosa in stomach, normal duodenum  Colonoscopy 08/21/2022: Diverticulosis in sigmoid and descending colon.  Nonbleeding internal hemorrhoids.  No specimens collected  Patient also has had rectal bleeding back in 2020 when he underwent a colonoscopy which identified 2 polyps from the transverse colon and tubular friable lung nodule in the proximal anus thought to be a source of bleeding and anemia at that time.  Path report showed polyps as inflammatory.  Patient states he has had intermittent rectal bleeding for many years.  He states it will last for a few days and then resolve.  However, he reports he has had rectal bleeding ongoing for the last 10 days.  He states he has 2 bowel movements a day and with each bowel movement he has seen bright red gelatinous blood in the toilet and on the tissue paper.  Also will have some dripping blood when he is sitting on the commode.  States it looks to be about 10 spoonfuls.  Last episode of bleeding was 5/22 AM prior to coming to the hospital.  Has not had any episodes of bleeding while being admitted.  Denies abdominal pain, nausea, vomiting, weight loss.  Denies melena.  Patient states he is unable to swallow VCE so Dr. Milas Hock plan was to do EGD with VCE placement for further evaluation of IDA.  He would like VCE  during admission.  Upon admission vital signs stable.  Hgb 8.0 (9.8 on 10/06/2022), platelet count 343.  BUN 15, creatinine 0.76.  FOBT negative.   Past Medical History:  Diagnosis Date   Anxiety    Hepatitis     Surgical History:  He  has a past surgical history that includes Esophagogastroduodenoscopy (egd) with propofol (N/A, 08/21/2022); Colonoscopy with propofol (N/A, 08/21/2022); biopsy (08/21/2022); and polypectomy (08/21/2022). Family History:  His family history includes Diabetes in his father; Hypertension in his father. Social History:   reports that he has never smoked. He has never used smokeless tobacco. He reports current alcohol use. He reports that he does not use drugs.  Prior to Admission medications   Medication Sig Start Date End Date Taking? Authorizing Provider  B Complex Vitamins (VITAMIN B COMPLEX) TABS Take 1 tablet by mouth daily.   Yes [provider]  CALCIUM PO Take 1 tablet by mouth daily.   Yes [provider]  Omeprazole 20 MG TBEC Take 2 tablets (40 mg total) by mouth daily. Patient taking differently: Take 20 mg by mouth daily. 08/21/22  Yes Lanae Boast, MD    Current Facility-Administered Medications  Medication Dose Route Frequency Provider Last Rate Last Admin   0.9 %  sodium chloride infusion   Intravenous Continuous [place], Eric J, DO 75 mL/hr at 11/26/22 0000 Continued from Pre-op at 11/26/22 0000   acetaminophen (TYLENOL) tablet 650 mg  650 mg Oral Q6H  PRN [place], Alvira Philips, DO       Or   acetaminophen (TYLENOL) suppository 650 mg  650 mg Rectal Q6H PRN [place], Eric J, DO       albuterol (PROVENTIL) (2.5 MG/3ML) 0.083% nebulizer solution 2.5 mg  2.5 mg Nebulization Q2H PRN [place], Eric J, DO       ferric gluconate (FERRLECIT) 250 mg in sodium chloride 0.9 % 250 mL IVPB  250 mg Intravenous Daily [place], Eric J, DO       folic acid (FOLVITE) tablet 1 mg  1 mg Oral Daily [place], Alvira Philips, DO   1 mg at 11/25/22 2152   LORazepam  (ATIVAN) tablet 1-4 mg  1-4 mg Oral Q1H PRN [place], Eric J, DO       Or   LORazepam (ATIVAN) injection 1-4 mg  1-4 mg Intravenous Q1H PRN [place], Eric J, DO       multivitamin with minerals tablet 1 tablet  1 tablet Oral Daily [place], Alvira Philips, DO   1 tablet at 11/25/22 2152   ondansetron (ZOFRAN) tablet 4 mg  4 mg Oral Q6H PRN [place], Alvira Philips, DO       Or   ondansetron [facility]) injection 4 mg  4 mg Intravenous Q6H PRN [place], Alvira Philips, DO       pantoprozole (PROTONIX) 80 mg /NS 100 mL infusion  8 mg/hr Intravenous Continuous Charlynne Pander, MD 10 mL/hr at 11/26/22 0601 8 mg/hr at 11/26/22 0601   polyethylene glycol (MIRALAX / GLYCOLAX) packet 17 g  17 g Oral Daily PRN [place], Eric J, DO       thiamine (VITAMIN B1) tablet 100 mg  100 mg Oral Daily [place], Alvira Philips, DO   100 mg at 11/25/22 2152   Or   thiamine (VITAMIN B1) injection 100 mg  100 mg Intravenous Daily [place], Alvira Philips, DO        Allergies as of 11/25/2022   (No Known Allergies)    Review of Systems  Constitutional:  Negative for chills, fever, malaise/fatigue and weight loss.  HENT:  Negative for hearing loss and tinnitus.   Eyes:  Negative for blurred vision and double vision.  Respiratory:  Negative for cough and hemoptysis.   Cardiovascular:  Negative for chest pain and palpitations.  Gastrointestinal:  Positive for blood in stool. Negative for abdominal pain, constipation, diarrhea, heartburn, melena, nausea and vomiting.  Genitourinary:  Negative for dysuria and urgency.  Musculoskeletal:  Negative for myalgias and neck pain.  Skin:  Negative for itching and rash.  Neurological:  Negative for seizures and loss of consciousness.  Psychiatric/Behavioral:  Negative for depression and suicidal ideas.        Physical Exam:  Vital signs in last 24 hours: Temp:  [98.1 F (36.7 C)-98.5 F (36.9 C)] 98.2 F (36.8 C) (05/23 0429) Pulse Rate:  [69-89] 73 (05/23 0429) Resp:  [11-20] 18 (05/23 0429) BP:  (104-119)/(64-75) 104/64 (05/23 0429) SpO2:  [97 %-100 %] 98 % (05/23 0429) Weight:  [78 kg] 78 kg (05/22 1500) Last BM Date : 11/25/22 Last BM recorded by nurses in past 5 days Stool Type: Type 2 (Lump and sausage like) (11/25/2022  8:30 PM)  Physical Exam Constitutional:      Appearance: Normal appearance. He is normal weight. He is not ill-appearing.  HENT:     Nose: Nose normal. No congestion.     Mouth/Throat:     Mouth: Mucous membranes are moist.     Pharynx: Oropharynx  is clear.  Eyes:     General: No scleral icterus.    Extraocular Movements: Extraocular movements intact.  Cardiovascular:     Rate and Rhythm: Normal rate and regular rhythm.  Pulmonary:     Effort: Pulmonary effort is normal. No respiratory distress.  Abdominal:     General: Abdomen is flat. Bowel sounds are normal. There is no distension.     Palpations: Abdomen is soft. There is no mass.     Tenderness: There is no abdominal tenderness. There is no guarding or rebound.     Hernia: No hernia is present.  Musculoskeletal:        General: No swelling. Normal range of motion.     Cervical back: Normal range of motion and neck supple.  Skin:    General: Skin is warm and dry.     Coloration: Skin is not jaundiced.  Neurological:     General: No focal deficit present.     Mental Status: He is oriented to person, place, and time.  Psychiatric:        Mood and Affect: Mood normal.        Behavior: Behavior normal.        Thought Content: Thought content normal.        Judgment: Judgment normal.      LAB RESULTS: Recent Labs    11/25/22 1527 11/25/22 2109 11/26/22 0441  WBC 4.5  --  4.7  HGB 8.0* 7.4* 7.7*  HCT 28.4* 25.8* 27.3*  PLT 343  --  324   BMET Recent Labs    11/25/22 1527 11/26/22 0441  NA 138 136  K 4.0 4.1  CL 104 105  CO2 26 24  GLUCOSE 119* 96  BUN 15 9  CREATININE 0.76 0.84  CALCIUM 8.5* 8.3*   LFT Recent Labs    11/25/22 1527  PROT 7.2  ALBUMIN 3.7  AST 19  ALT  20  ALKPHOS 101  BILITOT 0.5   PT/INR No results for input(s): "LABPROT", "INR" in the last 72 hours.  STUDIES: No results found.    Impression    Lower GI bleed Iron deficiency anemia -Hgb 7.7 (9.8 61-month ago) -BUN 9, creatinine 0.4 -Iron 14, TIBC 487, saturation 3% -Ferritin 2 -Recent EGD/colonoscopy February 2024 showed gastric fundic gland polyps.  Internal hemorrhoids and diverticulosis. DDx includes diverticular bleed versus hemorrhoidal bleed.  However, would have to be a significant hemorrhoidal bleed to cause this level of iron deficiency anemia.  Patient would like further evaluation with VCE, though reports he would be unable to swallow PillCam so original plan was to place it endoscopically. However, he has not attempted to swallow pillcam    Plan   -If recurrent large-volume hematochezia in a short timeframe would recommend CTA.  If positive, IR consultation. -No previous attempts to swallow PillCam in the past.  Would recommend attempting VCE this morning.  If unable to swallow it then can consider placing endoscopically.  However, doing EGD to place PillCam would present higher risk. -Continue daily CBC and transfuse as needed to maintain HGB > 7  -Continue Protonix  Thank you for your kind consultation, we will continue to follow.   Bayley Leanna Sato  11/26/2022, 7:59 AM    Attending physician's note  I have taken a history, reviewed the chart and examined the patient. I performed a substantive portion of this encounter, including complete performance of at least one of the key components, in conjunction with the APP. I agree  with the APP's note, impression and recommendations.    57 year old very pleasant male with history of iron deficiency anemia admitted with painless hematochezia.  He recently had EGD and colonoscopy in February 2024 for workup of iron deficiency anemia. No further bleeding since yesterday afternoon  Patient has pill dysphagia, reports  having pill getting stuck in his throat (Centrum multivitamin) few years ago and he had to cough it back up.  He has no difficulty swallowing food. Will plan for EGD with empiric esophageal dilation, possible proximal esophageal web/ring and placement of small bowel video capsule for further evaluation of iron deficiency anemia and painless hematochezia, exclude small bowel source of bleeding  The risks and benefits as well as alternatives of endoscopic procedure(s) have been discussed and reviewed. All questions answered. The patient agrees to proceed.   Clear liquids Bowel prep with MiraLAX and simethicone N.p.o. after 5 AM   The patient was provided an opportunity to ask questions and all were answered. The patient agreed with the plan and demonstrated an understanding of the instructions.  Iona Beard , MD [phone]

## 2022-11-27 ENCOUNTER — Encounter (HOSPITAL_COMMUNITY): Payer: Self-pay | Admitting: Internal Medicine

## 2022-11-27 ENCOUNTER — Inpatient Hospital Stay (HOSPITAL_COMMUNITY): Payer: 59 | Admitting: Anesthesiology

## 2022-11-27 ENCOUNTER — Encounter (HOSPITAL_COMMUNITY)
Admission: EM | Disposition: A | Discharge status: Home / Self Care | Payer: Self-pay | Source: Home / Self Care | Attending: Internal Medicine

## 2022-11-27 DIAGNOSIS — K759 Inflammatory liver disease, unspecified: Secondary | ICD-10-CM

## 2022-11-27 DIAGNOSIS — R1319 Other dysphagia: Secondary | ICD-10-CM | POA: Diagnosis not present

## 2022-11-27 DIAGNOSIS — D509 Iron deficiency anemia, unspecified: Secondary | ICD-10-CM | POA: Diagnosis not present

## 2022-11-27 DIAGNOSIS — K922 Gastrointestinal hemorrhage, unspecified: Secondary | ICD-10-CM | POA: Diagnosis not present

## 2022-11-27 HISTORY — PX: ESOPHAGOGASTRODUODENOSCOPY: SHX5428

## 2022-11-27 HISTORY — PX: MALONEY DILATION: SHX5535

## 2022-11-27 HISTORY — PX: GIVENS CAPSULE STUDY: SHX5432

## 2022-11-27 LAB — CBC
HCT: 27.5 % — ABNORMAL LOW (ref 39.0–52.0)
Hemoglobin: 7.6 g/dL — ABNORMAL LOW (ref 13.0–17.0)
MCH: 18.6 pg — ABNORMAL LOW (ref 26.0–34.0)
MCHC: 27.6 g/dL — ABNORMAL LOW (ref 30.0–36.0)
MCV: 67.4 fL — ABNORMAL LOW (ref 80.0–100.0)
Platelets: 335 10*3/uL (ref 150–400)
RBC: 4.08 MIL/uL — ABNORMAL LOW (ref 4.22–5.81)
RDW: 16.1 % — ABNORMAL HIGH (ref 11.5–15.5)
WBC: 5.1 10*3/uL (ref 4.0–10.5)
nRBC: 0 % (ref 0.0–0.2)

## 2022-11-27 LAB — BASIC METABOLIC PANEL
Anion gap: 7 (ref 5–15)
BUN: 10 mg/dL (ref 6–20)
CO2: 23 mmol/L (ref 22–32)
Calcium: 8.6 mg/dL — ABNORMAL LOW (ref 8.9–10.3)
Chloride: 105 mmol/L (ref 98–111)
Creatinine, Ser: 0.9 mg/dL (ref 0.61–1.24)
GFR, Estimated: >60 mL/min (ref >60)
Glucose, Bld: 92 mg/dL (ref 70–99)
Potassium: 3.7 mmol/L (ref 3.5–5.1)
Sodium: 135 mmol/L (ref 135–145)

## 2022-11-27 LAB — HEMOGLOBIN AND HEMATOCRIT, BLOOD
HCT: 27.6 % — ABNORMAL LOW (ref 39.0–52.0)
HCT: 27.1 % — ABNORMAL LOW (ref 39.0–52.0)
HCT: 28.3 % — ABNORMAL LOW (ref 39.0–52.0)
Hemoglobin: 7.8 g/dL — ABNORMAL LOW (ref 13.0–17.0)
Hemoglobin: 7.8 g/dL — ABNORMAL LOW (ref 13.0–17.0)
Hemoglobin: 8 g/dL — ABNORMAL LOW (ref 13.0–17.0)

## 2022-11-27 SURGERY — EGD (ESOPHAGOGASTRODUODENOSCOPY)
Anesthesia: Monitor Anesthesia Care

## 2022-11-27 MED ORDER — PROPOFOL 500 MG/50ML IV EMUL
INTRAVENOUS | Status: DC | PRN
  Administered 2022-11-27: 150 ug/kg/min via INTRAVENOUS

## 2022-11-27 MED ORDER — PANTOPRAZOLE SODIUM 40 MG PO TBEC
40.0000 mg | DELAYED_RELEASE_TABLET | Freq: Two times a day (BID) | ORAL | Status: DC
  Administered 2022-11-27 – 2022-11-28 (×2): 40 mg via ORAL
  Filled 2022-11-27 (×2): qty 1

## 2022-11-27 MED ORDER — LACTATED RINGERS IV SOLN: INTRAVENOUS | Status: DC | PRN

## 2022-11-27 MED ORDER — SUCRALFATE 1 GM/10ML PO SUSP
1.0000 g | Freq: Two times a day (BID) | ORAL | Status: DC
  Administered 2022-11-27 – 2022-11-28 (×2): 1 g via ORAL
  Filled 2022-11-27 (×2): qty 10

## 2022-11-27 SURGICAL SUPPLY — 1 items: TOWEL COTTON PACK 4EA (MISCELLANEOUS) ×4 IMPLANT

## 2022-11-27 NOTE — Anesthesia Postprocedure Evaluation (Signed)
Anesthesia Post Note  Patient: Troy Stevenson  Procedure(s) Performed: ESOPHAGOGASTRODUODENOSCOPY (EGD) GIVENS CAPSULE STUDY MALONEY DILATION     Patient location during evaluation: PACU Anesthesia Type: MAC Level of consciousness: awake and alert Pain management: pain level controlled Vital Signs Assessment: post-procedure vital signs reviewed and stable Respiratory status: spontaneous breathing, nonlabored ventilation, respiratory function stable and patient connected to nasal cannula oxygen Cardiovascular status: stable and blood pressure returned to baseline Postop Assessment: no apparent nausea or vomiting Anesthetic complications: no  No notable events documented.  Last Vitals:  Vitals:   11/27/22 0531 11/27/22 1219  BP: 118/68 123/67  Pulse: 64 81  Resp: 15 14  Temp: 36.7 C 36.7 C  SpO2: 98% 98%    Last Pain:  Vitals:   11/27/22 1219  TempSrc: Temporal  PainSc: 0-No pain                 [name] S

## 2022-11-27 NOTE — Op Note (Signed)
[facility] Patient Name: Troy Stevenson Procedure Date: 11/27/2022 MRN: [id number] Attending MD: Napoleon Form , MD, [id number] Date of Birth: December 24, 1965 CSN: [id number] Age: 57 Admit Type: Inpatient Procedure:                Upper GI endoscopy Indications:              Gastrointestinal bleeding of unknown origin,                            Suspected upper gastrointestinal bleeding in                            patient with unexplained iron deficiency anemia,                            Dysphagia Providers:                Napoleon Form, MD, Pollie Friar RN, RN,                            Irene Shipper, Technician, Mirian Mo, CRNA Referring MD:              Medicines:                Monitored Anesthesia Care Complications:            No immediate complications. Estimated Blood Loss:     Estimated blood loss was minimal. Procedure:                Pre-Anesthesia Assessment:                           - Prior to the procedure, a History and Physical                            was performed, and patient medications and                            allergies were reviewed. The patient's tolerance of                            previous anesthesia was also reviewed. The risks                            and benefits of the procedure and the sedation                            options and risks were discussed with the patient.                            All questions were answered, and informed consent                            was obtained. Prior Anticoagulants: The patient has  taken no anticoagulant or antiplatelet agents. ASA                            Grade Assessment: II - A patient with mild systemic                            disease. After reviewing the risks and benefits,                            the patient was deemed in satisfactory condition to                            undergo the procedure.                            After obtaining informed consent, the endoscope was                            passed under direct vision. Throughout the                            procedure, the patient's blood pressure, pulse, and                            oxygen saturations were monitored continuously. The                            [id number]-H190 ([id number]) Olympus endoscope was introduced                            through the mouth, and advanced to the second part                            of duodenum. The upper GI endoscopy was                            accomplished without difficulty. The patient                            tolerated the procedure well. Scope In: Scope Out: Findings:      No endoscopic abnormality was evident in the esophagus to explain the       patient's complaint of dysphagia. It was decided, however, to proceed       with dilation of the entire esophagus. The scope was withdrawn. Dilation       was performed with a Maloney dilator with no resistance at 54 Fr. The       dilation site was examined following endoscope reinsertion and showed       mild mucosal disruption and moderate improvement in luminal narrowing.      The Z-line was regular and was found 40 cm from the incisors.      The stomach was normal.      The cardia and gastric fundus were normal on retroflexion.      The examined duodenum was normal. Using the endoscope, the video  capsule       enteroscope was advanced into the second portion of the duodenum. Impression:               - No endoscopic esophageal abnormality to explain                            patient's dysphagia. Esophagus dilated.                           - Z-line regular, 40 cm from the incisors.                           - Normal stomach.                           - Normal examined duodenum.                           - Successful completion of the Video Capsule                            Enteroscope placement.                           - No specimens  collected. Moderate Sedation:      N/A Recommendation:           - Patient has a contact number available for                            emergencies. The signs and symptoms of potential                            delayed complications were discussed with the                            patient. Return to normal activities tomorrow.                            Written discharge instructions were provided to the                            patient.                           - Follow diet instructions as per pill camera                            protocol                           - Continue present medications.                           - Follow an antireflux regimen.                           - Use Protonix (pantoprazole) 40 mg  PO BID for 2                            months.                           - Use sucralfate suspension 1 gram PO BID for 1                            week.                           - If hemoglobin remains stable with no evidence of                            ongoing GI bleed, ok to discharge patient home once                            he completes the small bowel video capsule study                           - GI will contact patient with result on small                            bowel pill camera and arrange for follow up Procedure Code(s):        --- Professional ---                           [phone], Esophagogastroduodenoscopy, flexible,                            transoral; diagnostic, including collection of                            specimen(s) by brushing or washing, when performed                            (separate procedure)                           43450, Dilation of esophagus, by unguided sound or                            bougie, single or multiple passes Diagnosis Code(s):        --- Professional ---                           R13.10, Dysphagia, unspecified                           K92.2, Gastrointestinal hemorrhage, unspecified                            D50.9, Iron deficiency anemia, unspecified CPT copyright 2022 [employer]. All rights reserved. The codes documented in this report are preliminary and upon coder review may  be revised to meet current compliance requirements. Napoleon Form, MD 11/27/2022 1:38:20 PM This report has been signed electronically. Number of Addenda: 0

## 2022-11-27 NOTE — Transfer of Care (Signed)
Immediate Anesthesia Transfer of Care Note  Patient: Troy Stevenson  Procedure(s) Performed: ESOPHAGOGASTRODUODENOSCOPY (EGD) GIVENS CAPSULE STUDY  Patient Location: PACU  Anesthesia Type:MAC  Level of Consciousness: awake, alert , and oriented  Airway & Oxygen Therapy: Patient Spontanous Breathing and Patient connected to face mask oxygen  Post-op Assessment: Report given to RN and Post -op Vital signs reviewed and stable  Post vital signs: Reviewed and stable  Last Vitals:  Vitals Value Taken Time  BP    Temp    Pulse 90 11/27/22 1322  Resp 23 11/27/22 1322  SpO2 100 % 11/27/22 1322  Vitals shown include unvalidated device data.  Last Pain:  Vitals:   11/27/22 1219  TempSrc: Temporal  PainSc: 0-No pain         Complications: No notable events documented.

## 2022-11-27 NOTE — Anesthesia Preprocedure Evaluation (Signed)
Anesthesia Evaluation  Patient identified by MRN, date of birth, ID band Patient awake    Reviewed: Allergy & Precautions, H&P , NPO status , Patient's Chart, lab work & pertinent test results  Airway Mallampati: II  TM Distance: >3 FB Neck ROM: Full    Dental no notable dental hx.    Pulmonary neg pulmonary ROS   Pulmonary exam normal breath sounds clear to auscultation       Cardiovascular negative cardio ROS Normal cardiovascular exam Rhythm:Regular Rate:Normal     Neuro/Psych negative neurological ROS  negative psych ROS   GI/Hepatic negative GI ROS,,,(+)     substance abuse  alcohol use, Hepatitis -  Endo/Other  negative endocrine ROS    Renal/GU negative Renal ROS  negative genitourinary   Musculoskeletal negative musculoskeletal ROS (+)    Abdominal   Peds negative pediatric ROS (+)  Hematology  (+) Blood dyscrasia, anemia   Anesthesia Other Findings   Reproductive/Obstetrics negative OB ROS                             Anesthesia Physical Anesthesia Plan  ASA: 3  Anesthesia Plan: MAC   Post-op Pain Management: Minimal or no pain anticipated   Induction: Intravenous  PONV Risk Score and Plan: 1 and Propofol infusion and Treatment may vary due to age or medical condition  Airway Management Planned: Nasal Cannula  Additional Equipment:   Intra-op Plan:   Post-operative Plan:   Informed Consent: I have reviewed the patients History and Physical, chart, labs and discussed the procedure including the risks, benefits and alternatives for the proposed anesthesia with the patient or authorized representative who has indicated his/her understanding and acceptance.     Dental advisory given  Plan Discussed with: CRNA and Surgeon  Anesthesia Plan Comments:        Anesthesia Quick Evaluation

## 2022-11-27 NOTE — Progress Notes (Signed)
2  more medium loose bowel movements with frank blood streaks/clots'  Total of 3 loose stools over 8 hrs  placed on Enteric precautions  ON call notified

## 2022-11-27 NOTE — Progress Notes (Signed)
PROGRESS NOTE    Troy Stevenson  ZOX:[id number] DOB: 09/21/1965 DOA: 11/25/2022 PCP: Georgina Quint, MD    Brief Narrative:   Troy Stevenson is a 57 y.o. male with past medical history significant for EtOH abuse, colon polyps, internal hemorrhoids, diverticulosis, iron deficiency anemia who presented to [facility] ED on 5/22 with complaints of bright red blood per rectum over the last 10 days.  Otherwise patient asymptomatic, denies dizziness, no headache, no fatigue, no abdominal pain, no fever/chills/night sweats, no focal weakness, no fatigue, no paresthesias.  Denies use of anticoagulants/antiplatelets.   In the ED, temperature 98.5 F, HR 89, RR 20, BP 114/75, SpO2 100% on room air.  WBC 4.5, hemoglobin 8.0 (9.8 on 10/06/2022), platelet count 343.  Sodium 138, potassium 4.0, chloride 104, glucose 119, BUN 15, creatinine 0.76.  AST 19, ALT 20, total bilirubin 0.5.  FOBT negative.  GI was consulted, who recommended admission and will consult on the patient in a.m.  [facility] consulted for admission for further evaluation and management of concern for lower GI bleed.  Assessment & Plan:   Lower GI bleed Iron deficiency anemia Patient presenting to ED with bright red blood per rectum over the last 10 days.  Hemoglobin 8.0 on admission, most recent 9.8 on 10/06/2022.  Was previously hospitalized in February 2024 underwent EGD/colonoscopy with findings of multiple semipedunculated polyps with no bleeding in the gastric fundus/gastric body, colonic polyps in which she underwent polypectomy, internal hemorrhoids and diverticulosis.  Denies melena.  FOBT negative.  Suspect etiology of lower GI bleed related to diverticulosis versus internal hemorrhoids.  Anemia panel with iron 14, TIBC 47, ferritin 2, B12 542, folate 24.3. -- [facility] following, appreciate assistance -- Hgb 8.0>7.4>7.7>7.9>8.4>7.8>7.6 (9.8 on 4/2) -- Currently not actively bleeding, unless significant bleeding  would not benefit from CT angiogram GI study or nuclear medicine bleeding scan at this time -- Protonix drip -- IV iron x 2 -- N.p.o., GI plans EGD/capsule endoscopy today -- Continue to monitor H&H every 6 hours and CBC daily; transfuse for hemoglobin less than 7.0 or active bleeding   EtOH use disorder -- CIWA protocol with symptom triggered Ativan     DVT prophylaxis: SCDs Start: 11/25/22 2011    Code Status: Full Code Family Communication: No family present at bedside this morning  Disposition Plan:  Level of care: Telemetry Status is: Inpatient Remains inpatient appropriate because: Pending EGD/capsule endoscopy today, anticipate likely discharge home tomorrow if hemoglobin remains stable     Consultants:  [place] [facility]  Procedures:  EGD/capsule endoscopy planned for today  Antimicrobials:  None   Subjective: Patient seen examined bedside, resting comfortably.  Lying in bed.  Reports small amount of blood in bowel movement earlier this morning.  Hemoglobin stable, 7.6 this morning.  GI plans EGD/capsule endoscopy later this afternoon.   Patient with no specific complaints, specifically denies headache, no dizziness, no chest pain, no shortness of breath, no abdominal pain.  No acute concerns overnight per nursing staff.  Objective: Vitals:   11/26/22 1245 11/26/22 1949 11/27/22 0251 11/27/22 0531  BP: 109/70 (!) 107/56 99/62 118/68  Pulse: 82 86 63 64  Resp: 18 15  15   Temp: 98.3 F (36.8 C) 98.2 F (36.8 C) 98.6 F (37 C) 98.1 F (36.7 C)  TempSrc: Oral Oral Oral Oral  SpO2: 99% 98% 96% 98%  Weight:        Intake/Output Summary (Last 24 hours) at 11/27/2022 1133 Last data filed at 11/27/2022  0600 Gross per 24 hour  Intake 580.13 ml  Output 800 ml  Net -219.87 ml   Filed Weights   11/25/22 1500  Weight: 78 kg    Examination:  Physical Exam: GEN: NAD, alert and oriented x 3, wd/wn HEENT: NCAT, PERRL, EOMI, sclera clear, MMM PULM: CTAB  w/o wheezes/crackles, normal respiratory effort CV: RRR w/o M/G/R GI: abd soft, NTND, NABS, no R/G/M MSK: no peripheral edema, muscle strength globally intact 5/5 bilateral upper/lower extremities NEURO: CN II-XII intact, no focal deficits, sensation to light touch intact PSYCH: normal mood/affect Integumentary: dry/intact, no rashes or wounds    Data Reviewed: I have personally reviewed following labs and imaging studies  CBC: Recent Labs  Lab 11/25/22 1527 11/25/22 2109 11/26/22 0441 11/26/22 1142 11/26/22 1642 11/27/22 0128 11/27/22 0540  WBC 4.5  --  4.7  --   --   --  5.1  HGB 8.0*   < > 7.7* 7.9* 8.4* 7.8* 7.6*  HCT 28.4*   < > 27.3* 28.1* 30.2* 27.6* 27.5*  MCV 68.4*  --  67.6*  --   --   --  67.4*  PLT 343  --  324  --   --   --  335   < > = values in this interval not displayed.   Basic Metabolic Panel: Recent Labs  Lab 11/25/22 1527 11/26/22 0441 11/27/22 0540  NA 138 136 135  K 4.0 4.1 3.7  CL 104 105 105  CO2 26 24 23   GLUCOSE 119* 96 92  BUN 15 9 10   CREATININE 0.76 0.84 0.90  CALCIUM 8.5* 8.3* 8.6*  MG  --  1.8  --    GFR: Estimated Creatinine Clearance: 89.3 mL/min (by C-G formula based on SCr of 0.9 mg/dL). Liver Function Tests: Recent Labs  Lab 11/25/22 1527  AST 19  ALT 20  ALKPHOS 101  BILITOT 0.5  PROT 7.2  ALBUMIN 3.7   No results for input(s): "LIPASE", "AMYLASE" in the last 168 hours. No results for input(s): "AMMONIA" in the last 168 hours. Coagulation Profile: No results for input(s): "INR", "PROTIME" in the last 168 hours. Cardiac Enzymes: No results for input(s): "CKTOTAL", "CKMB", "CKMBINDEX", "TROPONINI" in the last 168 hours. BNP (last 3 results) No results for input(s): "PROBNP" in the last 8760 hours. HbA1C: No results for input(s): "HGBA1C" in the last 72 hours. CBG: No results for input(s): "GLUCAP" in the last 168 hours. Lipid Profile: No results for input(s): "CHOL", "HDL", "LDLCALC", "TRIG", "CHOLHDL",  "LDLDIRECT" in the last 72 hours. Thyroid Function Tests: No results for input(s): "TSH", "T4TOTAL", "FREET4", "T3FREE", "THYROIDAB" in the last 72 hours. Anemia Panel: Recent Labs    11/25/22 2109  VITAMINB12 542  FOLATE 24.3  FERRITIN 2*  TIBC 487*  IRON 14*  RETICCTPCT 1.1   Sepsis Labs: No results for input(s): "PROCALCITON", "LATICACIDVEN" in the last 168 hours.  No results found for this or any previous visit (from the past 240 hour(s)).       Radiology Studies: No results found.      Scheduled Meds:  folic acid  1 mg Oral Daily   multivitamin with minerals  1 tablet Oral Daily   simethicone  80 mg Oral QID   thiamine  100 mg Oral Daily   Or   thiamine  100 mg Intravenous Daily   Continuous Infusions:  sodium chloride     ferric gluconate (FERRLECIT) IVPB 250 mg (11/27/22 1012)   pantoprazole 8 mg/hr (11/26/22 1941)  LOS: 1 day    Time spent: 51 minutes spent on chart review, discussion with nursing staff, consultants, updating family and interview/physical exam; more than 50% of that time was spent in counseling and/or coordination of care.    Alvira Philips [place], DO [facility] Available via Epic secure chat 7am-7pm After these hours, please refer to coverage provider listed on [email or web address] 11/27/2022, 11:33 AM

## 2022-11-27 NOTE — Interval H&P Note (Signed)
History and Physical Interval Note:  11/27/2022 11:32 AM  Troy Stevenson  has presented today for surgery, with the diagnosis of GI bleed.  The various methods of treatment have been discussed with the patient and family. After consideration of risks, benefits and other options for treatment, the patient has consented to  Procedure(s): ESOPHAGOGASTRODUODENOSCOPY (EGD) (N/A) with esophageal dilation for pill dysphagia and placement of GIVENS CAPSULE STUDY (N/A) as a surgical intervention.  The patient's history has been reviewed, patient examined, no change in status, stable for surgery.  I have reviewed the patient's chart and labs.  Questions were answered to the patient's satisfaction.     Adilene Areola

## 2022-11-28 DIAGNOSIS — D509 Iron deficiency anemia, unspecified: Secondary | ICD-10-CM | POA: Diagnosis not present

## 2022-11-28 DIAGNOSIS — K921 Melena: Secondary | ICD-10-CM | POA: Diagnosis not present

## 2022-11-28 DIAGNOSIS — R1319 Other dysphagia: Secondary | ICD-10-CM | POA: Diagnosis not present

## 2022-11-28 DIAGNOSIS — K922 Gastrointestinal hemorrhage, unspecified: Secondary | ICD-10-CM | POA: Diagnosis not present

## 2022-11-28 LAB — CBC
HCT: 27.5 % — ABNORMAL LOW (ref 39.0–52.0)
Hemoglobin: 7.8 g/dL — ABNORMAL LOW (ref 13.0–17.0)
MCH: 19.1 pg — ABNORMAL LOW (ref 26.0–34.0)
MCHC: 28.4 g/dL — ABNORMAL LOW (ref 30.0–36.0)
MCV: 67.4 fL — ABNORMAL LOW (ref 80.0–100.0)
Platelets: 357 10*3/uL (ref 150–400)
RBC: 4.08 MIL/uL — ABNORMAL LOW (ref 4.22–5.81)
RDW: 16.1 % — ABNORMAL HIGH (ref 11.5–15.5)
WBC: 6.4 10*3/uL (ref 4.0–10.5)
nRBC: 0 % (ref 0.0–0.2)

## 2022-11-28 MED ORDER — SODIUM CHLORIDE 0.9 % IV SOLN
250.0000 mg | Freq: Every day | INTRAVENOUS | Status: DC
  Administered 2022-11-28: 250 mg via INTRAVENOUS
  Filled 2022-11-28: qty 20

## 2022-11-28 MED ORDER — FERROUS SULFATE 325 (65 FE) MG PO TBEC: 325.0000 mg | DELAYED_RELEASE_TABLET | Freq: Two times a day (BID) | ORAL | 0 refills | Status: DC

## 2022-11-28 MED ORDER — PANTOPRAZOLE SODIUM 40 MG PO TBEC: 40.0000 mg | DELAYED_RELEASE_TABLET | Freq: Two times a day (BID) | ORAL | 0 refills | Status: DC

## 2022-11-28 MED ORDER — SUCRALFATE 1 G PO TABS: 1.0000 g | ORAL_TABLET | Freq: Two times a day (BID) | ORAL | 0 refills | Status: DC

## 2022-11-28 NOTE — Progress Notes (Signed)
MEDICATION RELATED CONSULT NOTE - INITIAL   Pharmacy Consult for IV Iron Indication: IDA  No Known Allergies  Patient Measurements: Height: 5\' 5"  (165.1 cm) Weight: 78 kg (171 lb 15.3 oz) IBW/kg (Calculated) : 61.5 Adjusted Body Weight:   Vital Signs: Temp: 98 F (36.7 C) (05/25 0531) Temp Source: Oral (05/25 0531) BP: 112/72 (05/25 0531) Pulse Rate: 71 (05/25 0531) Intake/Output from previous day: 05/24 0701 - 05/25 0700 In: 540 [P.O.:240; I.V.:300] Out: -  Intake/Output from this shift: No intake/output data recorded.  Labs: Recent Labs    11/25/22 1527 11/25/22 2109 11/26/22 0441 11/26/22 1142 11/27/22 0540 11/27/22 1535 11/27/22 2119 11/28/22 0550  WBC 4.5  --  4.7  --  5.1  --   --  6.4  HGB 8.0*   < > 7.7*   < > 7.6* 7.8* 8.0* 7.8*  HCT 28.4*   < > 27.3*   < > 27.5* 27.1* 28.3* 27.5*  PLT 343  --  324  --  335  --   --  357  CREATININE 0.76  --  0.84  --  0.90  --   --   --   MG  --   --  1.8  --   --   --   --   --   ALBUMIN 3.7  --   --   --   --   --   --   --   PROT 7.2  --   --   --   --   --   --   --   AST 19  --   --   --   --   --   --   --   ALT 20  --   --   --   --   --   --   --   ALKPHOS 101  --   --   --   --   --   --   --   BILITOT 0.5  --   --   --   --   --   --   --    < > = values in this interval not displayed.   Estimated Creatinine Clearance: 89.3 mL/min (by C-G formula based on SCr of 0.9 mg/dL).   Microbiology: No results found for this or any previous visit (from the past 720 hour(s)).  Medical History: Past Medical History:  Diagnosis Date   Anxiety    Hepatitis     Assessment: Ganzoni Equation for Anemia in MD Calc=1286mg  iron deficit  Goal of Therapy:  Hgb>=12  Plan:  Ferrlecit 250mg  IV daily x 2 on 5/23 and 5/24 Ferrlecit 250mg  IV daily x 3 more days Pharmacy will sign off. Please reconsult for further dosing assitance.    Gennifer Potenza S. Merilynn Finland, PharmD, BCPS Clinical Staff  Pharmacist [email or web address] Merilynn Finland, Brayleigh Rybacki Stillinger 11/28/2022,8:04 AM

## 2022-11-28 NOTE — Discharge Summary (Signed)
Physician Discharge Summary  Troy Stevenson:[id number] DOB: 1965-11-16 DOA: 11/25/2022  PCP: Georgina Quint, MD  Admit date: 11/25/2022 Discharge date: 11/28/2022  Admitted From: Home Disposition: Home  Recommendations for Outpatient Follow-up:  Follow up with PCP in 1-2 weeks Follow-up with gastroenterology, Dr. Tomasa Rand for repeat labs planned on Wednesday, 12/02/2022 Continue Protonix 40 mg p.o. twice daily x 2 months and Carafate 1 g p.o. twice daily x 1 week Continue to encourage alcohol cessation  Home Health: No Equipment/Devices: None  Discharge Condition: Stable CODE STATUS: Full code Diet recommendation: Regular diet  History of present illness:  Troy Stevenson is a 57 y.o. male with past medical history significant for EtOH abuse, colon polyps, internal hemorrhoids, diverticulosis, iron deficiency anemia who presented to [facility] ED on 5/22 with complaints of bright red blood per rectum over the last 10 days.  Otherwise patient asymptomatic, denies dizziness, no headache, no fatigue, no abdominal pain, no fever/chills/night sweats, no focal weakness, no fatigue, no paresthesias.  Denies use of anticoagulants/antiplatelets.   In the ED, temperature 98.5 F, HR 89, RR 20, BP 114/75, SpO2 100% on room air.  WBC 4.5, hemoglobin 8.0 (9.8 on 10/06/2022), platelet count 343.  Sodium 138, potassium 4.0, chloride 104, glucose 119, BUN 15, creatinine 0.76.  AST 19, ALT 20, total bilirubin 0.5.  FOBT negative.  GI was consulted, who recommended admission and will consult on the patient in a.m.  [facility] consulted for admission for further evaluation and management of concern for lower GI bleed.  Hospital course:  Lower GI bleed Iron deficiency anemia Patient presenting to ED with bright red blood per rectum over the last 10 days.  Hemoglobin 8.0 on admission, most recent 9.8 on 10/06/2022.  Was previously hospitalized in February 2024 underwent EGD/colonoscopy  with findings of multiple semipedunculated polyps with no bleeding in the gastric fundus/gastric body, colonic polyps in which she underwent polypectomy, internal hemorrhoids and diverticulosis.  Denies melena.  FOBT negative.  Suspect etiology of lower GI bleed related to diverticulosis versus internal hemorrhoids.  Anemia panel with iron 14, TIBC 47, ferritin 2, B12 542, folate 24.3.  GI was consulted and followed during hospital course.  Patient received IV iron x 2.  Underwent EGD with capsule endoscopy on 11/27/2022 with no significant findings or active GI bleeding, noted small superficial erosion with small amount of heme in the proximal small bowel otherwise unrevealing; although visualization limited due to inadequate prep.  Hemoglobin has remained stable, 7.8 at time of discharge.  Continue Protonix 40 mg p.o. twice daily, Carafate 1 g p.o. twice daily.  Encouraged alcohol cessation.  GI plans repeat labs next week on Wednesday, May 29 at their office.   EtOH use disorder Discussed need for complete cessation/abstinence.  Discharge Diagnoses:  Principal Problem:   Lower GI bleed Active Problems:   GI bleed    Discharge Instructions  Discharge Instructions     Call MD for:  difficulty breathing, headache or visual disturbances   Complete by: As directed    Call MD for:  extreme fatigue   Complete by: As directed    Call MD for:  persistant dizziness or light-headedness   Complete by: As directed    Call MD for:  persistant nausea and vomiting   Complete by: As directed    Call MD for:  severe uncontrolled pain   Complete by: As directed    Call MD for:  temperature >100.4   Complete by: As directed  Diet - low sodium heart healthy   Complete by: As directed    Increase activity slowly   Complete by: As directed       Allergies as of 11/28/2022   No Known Allergies      Medication List     STOP taking these medications    Omeprazole 20 MG Tbec       TAKE  these medications    CALCIUM PO Take 1 tablet by mouth daily.   ferrous sulfate 325 (65 FE) MG EC tablet Take 1 tablet (325 mg total) by mouth 2 (two) times daily.   pantoprazole 40 MG tablet Commonly known as: Protonix Take 1 tablet (40 mg total) by mouth 2 (two) times daily.   sucralfate 1 g tablet Commonly known as: Carafate Take 1 tablet (1 g total) by mouth 2 (two) times daily for 7 days.   Vitamin B Complex Tabs Take 1 tablet by mouth daily.        Follow-up Information     Georgina Quint, MD. Schedule an appointment as soon as possible for a visit in 1 week(s).   Specialty: Internal Medicine Contact information: [address] [place] [address] [phone]         Jenel Lucks, MD. Go on 12/02/2022.   Specialty: Gastroenterology Contact information: [address] [place] [address] [phone]                No Known Allergies  Consultations: [place] [facility]   Procedures/Studies: No results found.   Subjective: Patient seen examined bedside, resting calmly.  Lying in bed.  Seen by GI this morning with no skin findings on EGD/capsule endoscopy that was performed yesterday; and okay for discharge home.  Plan repeat labs next week.  Patient with no other questions or concerns at this time.  Denies headache, no fever/chills/night sweats, no nausea/vomiting/diarrhea, no chest pain, no palpitations, no shortness of breath, no abdominal pain, no focal weakness, no fatigue, no paresthesias.  No acute events overnight per nursing staff.  Discharge Exam: Vitals:   11/27/22 1949 11/28/22 0531  BP: 114/69 112/72  Pulse: 82 71  Resp: 20 20  Temp: 98.3 F (36.8 C) 98 F (36.7 C)  SpO2: 99% 97%   Vitals:   11/27/22 1417 11/27/22 1435 11/27/22 1949 11/28/22 0531  BP: (!) 100/48 (!) 104/55 114/69 112/72  Pulse: 70 73 82 71  Resp: 14 16 20 20   Temp:  98 F (36.7 C) 98.3 F (36.8 C) 98 F (36.7 C)  TempSrc:   Oral Oral Oral  SpO2: 99% 99% 99% 97%  Weight:      Height:        Physical Exam: GEN: NAD, alert and oriented x 3, wd/wn HEENT: NCAT, PERRL, EOMI, sclera clear, MMM, poor dentition PULM: CTAB w/o wheezes/crackles, normal respiratory effort CV: RRR w/o M/G/R GI: abd soft, NTND, NABS, no R/G/M MSK: no peripheral edema, muscle strength globally intact 5/5 bilateral upper/lower extremities NEURO: CN II-XII intact, no focal deficits, sensation to light touch intact PSYCH: normal mood/affect Integumentary: dry/intact, no rashes or wounds    The results of significant diagnostics from this hospitalization (including imaging, microbiology, ancillary and laboratory) are listed below for reference.     Microbiology: No results found for this or any previous visit (from the past 240 hour(s)).   Labs: BNP (last 3 results) No results for input(s): "BNP" in the last 8760 hours. Basic Metabolic Panel: Recent Labs  Lab 11/25/22  1527 11/26/22 0441 11/27/22 0540  NA 138 136 135  K 4.0 4.1 3.7  CL 104 105 105  CO2 26 24 23   GLUCOSE 119* 96 92  BUN 15 9 10   CREATININE 0.76 0.84 0.90  CALCIUM 8.5* 8.3* 8.6*  MG  --  1.8  --    Liver Function Tests: Recent Labs  Lab 11/25/22 1527  AST 19  ALT 20  ALKPHOS 101  BILITOT 0.5  PROT 7.2  ALBUMIN 3.7   No results for input(s): "LIPASE", "AMYLASE" in the last 168 hours. No results for input(s): "AMMONIA" in the last 168 hours. CBC: Recent Labs  Lab 11/25/22 1527 11/25/22 2109 11/26/22 0441 11/26/22 1142 11/27/22 0128 11/27/22 0540 11/27/22 1535 11/27/22 2119 11/28/22 0550  WBC 4.5  --  4.7  --   --  5.1  --   --  6.4  HGB 8.0*   < > 7.7*   < > 7.8* 7.6* 7.8* 8.0* 7.8*  HCT 28.4*   < > 27.3*   < > 27.6* 27.5* 27.1* 28.3* 27.5*  MCV 68.4*  --  67.6*  --   --  67.4*  --   --  67.4*  PLT 343  --  324  --   --  335  --   --  357   < > = values in this interval not displayed.   Cardiac Enzymes: No results for input(s):  "CKTOTAL", "CKMB", "CKMBINDEX", "TROPONINI" in the last 168 hours. BNP: Invalid input(s): "POCBNP" CBG: No results for input(s): "GLUCAP" in the last 168 hours. D-Dimer No results for input(s): "DDIMER" in the last 72 hours. Hgb A1c No results for input(s): "HGBA1C" in the last 72 hours. Lipid Profile No results for input(s): "CHOL", "HDL", "LDLCALC", "TRIG", "CHOLHDL", "LDLDIRECT" in the last 72 hours. Thyroid function studies No results for input(s): "TSH", "T4TOTAL", "T3FREE", "THYROIDAB" in the last 72 hours.  Invalid input(s): "FREET3" Anemia work up Recent Labs    11/25/22 2109  VITAMINB12 542  FOLATE 24.3  FERRITIN 2*  TIBC 487*  IRON 14*  RETICCTPCT 1.1   Urinalysis    Component Value Date/Time   COLORURINE AMBER (A) 03/01/2017 1801   APPEARANCEUR HAZY (A) 03/01/2017 1801   LABSPEC 1.031 (H) 03/01/2017 1801   PHURINE 5.0 03/01/2017 1801   GLUCOSEU NEGATIVE 03/01/2017 1801   HGBUR NEGATIVE 03/01/2017 1801   BILIRUBINUR NEGATIVE 03/01/2017 1801   KETONESUR 5 (A) 03/01/2017 1801   PROTEINUR 30 (A) 03/01/2017 1801   NITRITE NEGATIVE 03/01/2017 1801   LEUKOCYTESUR NEGATIVE 03/01/2017 1801   Sepsis Labs Recent Labs  Lab 11/25/22 1527 11/26/22 0441 11/27/22 0540 11/28/22 0550  WBC 4.5 4.7 5.1 6.4   Microbiology No results found for this or any previous visit (from the past 240 hour(s)).   Time coordinating discharge: Over 30 minutes  SIGNED:   Alvira Philips [place], DO  [facility] 11/28/2022, 12:10 PM

## 2022-11-28 NOTE — Progress Notes (Signed)
[facility] ROUNDING NOTE   Subjective: No further bleeding.  Overall feels good.   Objective: Vital signs in last 24 hours: Temp:  [97.9 F (36.6 C)-98.3 F (36.8 C)] 98 F (36.7 C) (05/25 0531) Pulse Rate:  [70-92] 71 (05/25 0531) Resp:  [11-26] 20 (05/25 0531) BP: (81-123)/(40-72) 112/72 (05/25 0531) SpO2:  [93 %-100 %] 97 % (05/25 0531) Weight:  [78 kg] 78 kg (05/24 1219) Last BM Date : 11/27/22 General: NAD  Intake/Output from previous day: 05/24 0701 - 05/25 0700 In: 540 [P.O.:240; I.V.:300] Out: -  Intake/Output this shift: Total I/O In: 240 [P.O.:240] Out: -    Lab Results: Recent Labs    11/26/22 0441 11/26/22 1142 11/27/22 0540 11/27/22 1535 11/27/22 2119 11/28/22 0550  WBC 4.7  --  5.1  --   --  6.4  HGB 7.7*   < > 7.6* 7.8* 8.0* 7.8*  PLT 324  --  335  --   --  357  MCV 67.6*  --  67.4*  --   --  67.4*   < > = values in this interval not displayed.   BMET Recent Labs    11/25/22 1527 11/26/22 0441 11/27/22 0540  NA 138 136 135  K 4.0 4.1 3.7  CL 104 105 105  CO2 26 24 23   GLUCOSE 119* 96 92  BUN 15 9 10   CREATININE 0.76 0.84 0.90  CALCIUM 8.5* 8.3* 8.6*   LFT Recent Labs    11/25/22 1527  PROT 7.2  ALBUMIN 3.7  AST 19  ALT 20  ALKPHOS 101  BILITOT 0.5   PT/INR No results for input(s): "INR" in the last 72 hours.    Imaging/Other results: No results found.    Assessment &Plan  57 year old very pleasant male with history of iron deficiency anemia admitted with painless hematochezia. Likely etiology of painless hematochezia is diverticular hemorrhage that has self resolved  S/p EGD with esophageal dilation for pill dysphagia and placement of small bowel video capsule.  Small bowel video capsule negative for any active GI bleeding.  Noted small superficial erosion with small amount of heme in the proximal small bowel otherwise unremarkable exam.  Visualization was limited due to inadequate prep  Complete  IV iron infusion Okay to discharge patient home from GI standpoint Will plan for repeat labs (CBC) through GI office next week, Wednesday, May 29. We will arrange for outpatient GI follow-up with Dr. Tomasa Rand  Advised patient to come to hospital if he develops recurrent episode of large-volume hematochezia  GI will sign off, please call with any questions     K. Scherry Ran , MD [phone]  [facility]

## 2022-11-30 ENCOUNTER — Encounter (HOSPITAL_COMMUNITY): Payer: Self-pay | Admitting: Gastroenterology

## 2022-12-01 ENCOUNTER — Telehealth: Payer: Self-pay | Admitting: *Deleted

## 2022-12-01 ENCOUNTER — Encounter: Payer: Self-pay | Admitting: *Deleted

## 2022-12-01 ENCOUNTER — Other Ambulatory Visit: Payer: Self-pay

## 2022-12-01 DIAGNOSIS — K922 Gastrointestinal hemorrhage, unspecified: Secondary | ICD-10-CM

## 2022-12-01 DIAGNOSIS — D509 Iron deficiency anemia, unspecified: Secondary | ICD-10-CM

## 2022-12-01 NOTE — Transitions of Care (Post Inpatient/ED Visit) (Signed)
12/01/2022  Name: Troy Stevenson MRN: [id number] DOB: 12/05/1965  Today's TOC FU Call Status: Today's TOC FU Call Status:: Successful TOC FU Call Competed TOC FU Call Complete Date: 12/01/22  Transition Care Management Follow-up Telephone Call Date of Discharge: 11/28/22 Discharge Facility: [facility]) Type of Discharge: Inpatient Admission How have you been since you were released from the hospital?: Better ("doing okay, but on Sunday night, my (R) arm started getting very sore painful and swollen-- I am having trouble moving it because it hurts-- this is where they put the IV in when I was in the hospital.  I have been using ice but it hasn't helped much") Any questions or concerns?: Yes Patient Questions/Concerns:: Reports (R) arm where IV was placed has gradually become swollen, painful, red, sore to touch with limitied mobility Patient Questions/Concerns Addressed: Other: (discussed treatment options to seek care and evaluation of clinical concerns around IV site post-hospital discharge; discussed options including PCP office visit; UCC vs. ED)  Items Reviewed: Did you receive and understand the discharge instructions provided?: Yes (thoroughly reviewed with patient who verbalizes good understanding of same) Medications obtained,verified, and reconciled?: Yes (Medications Reviewed) (Full medication reconciliation/ review completed; no concerns or discrepancies identified; confirmed patient obtained/ is taking all newly Rx'd medications as instructed; self-manages medications and denies questions/ concerns around medications today) Any new allergies since your discharge?: No Dietary orders reviewed?: Yes Type of Diet Ordered:: "Healthy" Do you have support at home?: Yes People in Home: child(ren), adult Name of Support/Comfort Primary Source: Reports independent in self-care activities; resides with adult daughter who assists as/ if needed/ indicated  Medications Reviewed  Today: Medications Reviewed Today     Reviewed by Michaela Corner, RN (Registered Nurse) on 12/01/22 at 1616  Med List Status: <None>   Medication Order Taking? Sig Documenting Provider Last Dose Status Informant  B Complex Vitamins (VITAMIN B COMPLEX) TABS [id number] Yes Take 1 tablet by mouth daily. [provider] Taking Active Self  CALCIUM PO [id number] Yes Take 1 tablet by mouth daily. [provider] Taking Active Self  ferrous sulfate 325 (65 FE) MG EC tablet [id number] Yes Take 1 tablet (325 mg total) by mouth 2 (two) times daily. [place], Alvira Philips, DO Taking Active   pantoprazole (PROTONIX) 40 MG tablet [id number] Yes Take 1 tablet (40 mg total) by mouth 2 (two) times daily. [place], Alvira Philips, DO Taking Active   sucralfate (CARAFATE) 1 g tablet [id number] Yes Take 1 tablet (1 g total) by mouth 2 (two) times daily for 7 days. [place], Eric J, DO Taking Active             Home Care and Equipment/Supplies: Were Home Health Services Ordered?: No Any new equipment or medical supplies ordered?: No  Functional Questionnaire: Do you need assistance with bathing/showering or dressing?: No Do you need assistance with meal preparation?: No Do you need assistance with eating?: No Do you have difficulty maintaining continence: No Do you need assistance with getting out of bed/getting out of a chair/moving?: No Do you have difficulty managing or taking your medications?: No  Follow up appointments reviewed: PCP Follow-up appointment confirmed?: Yes (care coordination outreach in real-time with scheduling care guide to successfully schedule hospital follow up PCP appointment 12/07/22- first available per care guide) Date of PCP follow-up appointment?: 12/07/22 Follow-up Provider: PCP; Dr. Ardeen Jourdain Follow-up appointment confirmed?: Yes Date of Specialist follow-up appointment?: 12/02/22 Follow-Up Specialty Provider:: GI provider- for labs and to get HFU  scheduled Do you need transportation to your follow-up appointment?: No Do you understand care options if your condition(s) worsen?: Yes-patient verbalized understanding  SDOH Interventions Today    Flowsheet Row Most Recent Value  SDOH Interventions   Food Insecurity Interventions Intervention Not Indicated  Transportation Interventions Intervention Not Indicated  [drives self]      TOC Interventions Today    Flowsheet Row Most Recent Value  TOC Interventions   TOC Interventions Discussed/Reviewed TOC Interventions Discussed, Arranged PCP follow up less than 12 days/Care Guide scheduled, S/S of infection  [triage assessment completed around reports of swollen (R) arm where IV was placed during hospitalization]      Interventions Today    Flowsheet Row Most Recent Value  Chronic Disease   Chronic disease during today's visit Other  [lower GI bleed]  General Interventions   General Interventions Discussed/Reviewed General Interventions Discussed, Doctor Visits  Doctor Visits Discussed/Reviewed Specialist, Doctor Visits Discussed, PCP  PCP/Specialist Visits Compliance with follow-up visit  Education Interventions   Education Provided Provided Education  Provided Verbal Education On Other, When to see the doctor  [reasons to go to [facility] vs. ED,  need to see PCP asap after hospitalization,  use of ice in setting of acute swelling]  Nutrition Interventions   Nutrition Discussed/Reviewed Nutrition Discussed  Pharmacy Interventions   Pharmacy Dicussed/Reviewed Pharmacy Topics Discussed  [Full medication review with updating medication list in EHR per patient report]      Caryl Pina, RN, BSN, CCRN Alumnus RN CM Care Coordination/ Transition of Care- [employer] Care Management [phone]: direct office

## 2022-12-02 ENCOUNTER — Encounter (HOSPITAL_COMMUNITY): Payer: Self-pay

## 2022-12-02 ENCOUNTER — Other Ambulatory Visit (INDEPENDENT_AMBULATORY_CARE_PROVIDER_SITE_OTHER): Payer: 59

## 2022-12-02 ENCOUNTER — Ambulatory Visit (HOSPITAL_COMMUNITY)
Admission: EM | Admit: 2022-12-02 | Discharge: 2022-12-02 | Disposition: A | Discharge status: Home / Self Care | Payer: 59 | Attending: Internal Medicine | Admitting: Internal Medicine

## 2022-12-02 DIAGNOSIS — K922 Gastrointestinal hemorrhage, unspecified: Secondary | ICD-10-CM | POA: Diagnosis not present

## 2022-12-02 DIAGNOSIS — I808 Phlebitis and thrombophlebitis of other sites: Secondary | ICD-10-CM

## 2022-12-02 DIAGNOSIS — D509 Iron deficiency anemia, unspecified: Secondary | ICD-10-CM

## 2022-12-02 LAB — CBC WITH DIFFERENTIAL/PLATELET
Basophils Absolute: 0.1 10*3/uL (ref 0.0–0.1)
Basophils Relative: 1 % (ref 0.0–3.0)
Eosinophils Absolute: 0 10*3/uL (ref 0.0–0.7)
Eosinophils Relative: 0.8 % (ref 0.0–5.0)
HCT: 30 % — ABNORMAL LOW (ref 39.0–52.0)
Hemoglobin: 9.1 g/dL — ABNORMAL LOW (ref 13.0–17.0)
Lymphocytes Relative: 16.4 % (ref 12.0–46.0)
Lymphs Abs: 1 10*3/uL (ref 0.7–4.0)
MCHC: 30.4 g/dL (ref 30.0–36.0)
MCV: 66 fl — ABNORMAL LOW (ref 78.0–100.0)
Monocytes Absolute: 0.4 10*3/uL (ref 0.1–1.0)
Monocytes Relative: 6.8 % (ref 3.0–12.0)
Neutro Abs: 4.7 10*3/uL (ref 1.4–7.7)
Neutrophils Relative %: 75 % (ref 43.0–77.0)
Platelets: 298 10*3/uL (ref 150.0–400.0)
RBC: 4.53 Mil/uL (ref 4.22–5.81)
RDW: 18 % — ABNORMAL HIGH (ref 11.5–15.5)
WBC: 6.2 10*3/uL (ref 4.0–10.5)

## 2022-12-02 MED ORDER — AMOXICILLIN-POT CLAVULANATE 875-125 MG PO TABS: 1.0000 | ORAL_TABLET | Freq: Two times a day (BID) | ORAL | 0 refills | Status: DC

## 2022-12-02 NOTE — ED Provider Notes (Signed)
[facility]    CSN: [id number] Arrival date & time: 12/02/22  1357      History   Chief Complaint Chief Complaint  Patient presents with   IV complication    HPI Troy Stevenson is a 57 y.o. male.   Patient presents to urgent care for evaluation of pain and swelling to the right antecubital space after an IV was maintained in the space during recent hospitalization.  Patient was discharged from the hospital approximately 4 days ago and began having pain to the IV site of the right [facility] the day that he was discharged.  He states while he was in the hospital, a nurse tried to reposition the IV and make it work again by "injecting air" into the vein, however this caused significant pain and the nurse took out the IV.  After discharge, he started experiencing pain to the site on Sunday, Nov 28, 2022.  Pain worsened over the next 2 days on Tuesday, May 26 and Wednesday, May 27, however redness started yesterday.  He has been able to feel swelling to the area of greatest tenderness since symptoms began on Sunday, May 25.  States the swelling feels like a tight knot underneath the skin causing significant pain with range of motion of the right elbow.  Denies numbness or tingling distally to injury/pain, recent trauma/injury to the right elbow, and fevers/chills.  No shortness of breath, chest pain, heart palpitations, or weakness.  He was admitted to the hospital for gastrointestinal bleeding and did not receive any potassium through the IV.  He does not take blood thinning medications.  Tolerating food and fluids well without nausea or vomiting.  Denies recent antibiotic/steroid use.  He has been using ice packs to the area to help with symptoms without much relief.     Past Medical History:  Diagnosis Date   Anxiety    Hepatitis     Patient Active Problem List   Diagnosis Date Noted   Lower GI bleed 11/26/2022   GI bleed 11/25/2022   Iron deficiency anemia 08/21/2022    Fundic gland polyposis of stomach 08/21/2022   Symptomatic anemia 08/19/2022   Chronic anxiety 02/24/2013    Past Surgical History:  Procedure Laterality Date   BIOPSY  08/21/2022   Procedure: BIOPSY;  Surgeon: Jenel Lucks, MD;  Location: [facility] ENDOSCOPY;  Service: Gastroenterology;;   COLONOSCOPY WITH PROPOFOL N/A 08/21/2022   Procedure: COLONOSCOPY WITH PROPOFOL;  Surgeon: Jenel Lucks, MD;  Location: [facility] ENDOSCOPY;  Service: Gastroenterology;  Laterality: N/A;   ESOPHAGOGASTRODUODENOSCOPY N/A 11/27/2022   Procedure: ESOPHAGOGASTRODUODENOSCOPY (EGD);  Surgeon: Napoleon Form, MD;  Location: [facility] ENDOSCOPY;  Service: Gastroenterology;  Laterality: N/A;   ESOPHAGOGASTRODUODENOSCOPY (EGD) WITH PROPOFOL N/A 08/21/2022   Procedure: ESOPHAGOGASTRODUODENOSCOPY (EGD) WITH PROPOFOL;  Surgeon: Jenel Lucks, MD;  Location: [facility] ENDOSCOPY;  Service: Gastroenterology;  Laterality: N/A;   GIVENS CAPSULE STUDY N/A 11/27/2022   Procedure: GIVENS CAPSULE STUDY;  Surgeon: Napoleon Form, MD;  Location: [facility] ENDOSCOPY;  Service: Gastroenterology;  Laterality: N/A;   MALONEY DILATION  11/27/2022   Procedure: Elease Hashimoto DILATION;  Surgeon: Napoleon Form, MD;  Location: [facility] ENDOSCOPY;  Service: Gastroenterology;;  54   POLYPECTOMY  08/21/2022   Procedure: POLYPECTOMY;  Surgeon: Jenel Lucks, MD;  Location: [facility] ENDOSCOPY;  Service: Gastroenterology;;       Home Medications    Prior to Admission medications   Medication Sig Start Date End Date Taking? Authorizing Provider  amoxicillin-clavulanate (  AUGMENTIN) 875-125 MG tablet Take 1 tablet by mouth every 12 (twelve) hours. 12/02/22  Yes Carlisle Beers, FNP  B Complex Vitamins (VITAMIN B COMPLEX) TABS Take 1 tablet by mouth daily.    [provider]  CALCIUM PO Take 1 tablet by mouth daily.    [provider]  ferrous sulfate 325 (65 FE) MG EC tablet Take 1 tablet (325 mg total) by mouth 2 (two) times daily.  11/28/22 02/26/23  [place], Alvira Philips, DO  pantoprazole (PROTONIX) 40 MG tablet Take 1 tablet (40 mg total) by mouth 2 (two) times daily. 11/28/22 01/27/23  [place], Alvira Philips, DO  sucralfate (CARAFATE) 1 g tablet Take 1 tablet (1 g total) by mouth 2 (two) times daily for 7 days. 11/28/22 12/05/22  [place], Eric J, DO    Family History Family History  Problem Relation Age of Onset   Hypertension Father    Diabetes Father    Colon cancer Neg Hx    Rectal cancer Neg Hx     Social History Social History   Tobacco Use   Smoking status: Never   Smokeless tobacco: Never  Substance Use Topics   Alcohol use: Yes    Comment: 4-5 beers daily   Drug use: No     Allergies   Patient has no known allergies.   Review of Systems Review of Systems Per HPI  Physical Exam Triage Vital Signs ED Triage Vitals [12/02/22 1420]  Enc Vitals Group     BP 125/69     Pulse Rate 96     Resp 16     Temp 98.2 F (36.8 C)     Temp Source Oral     SpO2 98 %     Weight      Height      Head Circumference      Peak Flow      Pain Score 10     Pain Loc      Pain Edu?      Excl. in GC?    No data found.  Updated Vital Signs BP 125/69 (BP Location: Left Arm)   Pulse 96   Temp 98.2 F (36.8 C) (Oral)   Resp 16   SpO2 98%   Visual Acuity Right Eye Distance:   Left Eye Distance:   Bilateral Distance:    Right Eye Near:   Left Eye Near:    Bilateral Near:     Physical Exam Vitals and nursing note reviewed.  Constitutional:      Appearance: He is not ill-appearing or toxic-appearing.  HENT:     Head: Normocephalic and atraumatic.     Right Ear: Hearing and external ear normal.     Left Ear: Hearing and external ear normal.     Nose: Nose normal.     Mouth/Throat:     Lips: Pink.  Eyes:     General: Lids are normal. Vision grossly intact. Gaze aligned appropriately.     Extraocular Movements: Extraocular movements intact.     Conjunctiva/sclera: Conjunctivae normal.  Cardiovascular:      Rate and Rhythm: Normal rate and regular rhythm.     Heart sounds: Normal heart sounds, S1 normal and S2 normal.  Pulmonary:     Effort: Pulmonary effort is normal. No respiratory distress.     Breath sounds: Normal breath sounds and air entry.  Musculoskeletal:     Cervical back: Neck supple.  Skin:    General: Skin is warm and dry.  Capillary Refill: Capillary refill takes less than 2 seconds.     Findings: Lesion present. No rash.     Comments: IV puncture site to the right medial antecubital space with underlying soft tissue swelling that is tender and firm to palpation.  Surrounding area of erythema and warmth present to the antecubital space and medial aspect of the right forearm/right upper arm.  See image below for further details.  Neurological:     General: No focal deficit present.     Mental Status: He is alert and oriented to person, place, and time. Mental status is at baseline.     Cranial Nerves: No dysarthria or facial asymmetry.  Psychiatric:        Mood and Affect: Mood normal.        Speech: Speech normal.        Behavior: Behavior normal.        Thought Content: Thought content normal.        Judgment: Judgment normal.      UC Treatments / Results  Labs (all labs ordered are listed, but only abnormal results are displayed) Labs Reviewed - No data to display  EKG   Radiology No results found.  Procedures Procedures (including critical care time)  Medications Ordered in UC Medications - No data to display  Initial Impression / Assessment and Plan / UC Course  I have reviewed the triage vital signs and the nursing notes.  Pertinent labs & imaging results that were available during my care of the patient were reviewed by me and considered in my medical decision making (see chart for details).   1.  Superficial thrombophlebitis of right upper extremity Presentation is consistent with above.  Warm compresses recommended frequently to help  dissolve clot.  Augmentin antibiotic twice daily for 7 days sent to pharmacy to be taken as prescribed to cover for infectious etiology due to significant swelling and warmth.  Patient is not experiencing any systemic signs of infection currently and there is low suspicion for DVT.  Dr. Leonides Grills also evaluated patient and agrees with plan.  Swelling will likely significantly improve and resolve in the next 3 to 5 days.  May use Tylenol as needed for pain.  Recommend PCP follow-up in the next 1 week for recheck with strict return precautions should he develop any new or worsening symptoms such as drainage from the site, fever, chills, or any other new or worsening symptoms.   Discussed physical exam and available lab work findings in clinic with patient.  Counseled patient regarding appropriate use of medications and potential side effects for all medications recommended or prescribed today. Discussed red flag signs and symptoms of worsening condition,when to call the PCP office, return to urgent care, and when to seek higher level of care in the emergency department. Patient verbalizes understanding and agreement with plan. All questions answered. Patient discharged in stable condition.    Final Clinical Impressions(s) / UC Diagnoses   Final diagnoses:  Superficial thrombophlebitis of right upper extremity     Discharge Instructions      Take Augmentin antibiotic twice daily for the next 7 days.  Apply warm compresses to the right arm frequently to help drain inflammation and clotting.  If you start to notice any worsening signs of infection such as worsening redness, swelling, drainage from the site, or if you develop a fever/chills, please return to urgent care for further evaluation.  If you develop any new or worsening symptoms or do not improve  in the next 2 to 3 days, please return.  If your symptoms are severe, please go to the emergency room.  Follow-up with your primary care provider  for further evaluation and management of your symptoms as well as ongoing wellness visits.  I hope you feel better!    ED Prescriptions     Medication Sig Dispense Auth. Provider   amoxicillin-clavulanate (AUGMENTIN) 875-125 MG tablet Take 1 tablet by mouth every 12 (twelve) hours. 14 tablet Carlisle Beers, FNP      PDMP not reviewed this encounter.   Carlisle Beers, [place] 12/02/22 1606

## 2022-12-02 NOTE — Discharge Instructions (Signed)
Take Augmentin antibiotic twice daily for the next 7 days.  Apply warm compresses to the right arm frequently to help drain inflammation and clotting.  If you start to notice any worsening signs of infection such as worsening redness, swelling, drainage from the site, or if you develop a fever/chills, please return to urgent care for further evaluation.  If you develop any new or worsening symptoms or do not improve in the next 2 to 3 days, please return.  If your symptoms are severe, please go to the emergency room.  Follow-up with your primary care provider for further evaluation and management of your symptoms as well as ongoing wellness visits.  I hope you feel better!

## 2022-12-02 NOTE — ED Triage Notes (Signed)
Pt c/o IV complication after 4 day encounter at the hospital. Describes an event where someone injected air into an IV. Now it is painful, red, swollen and hard causing LROM.

## 2022-12-07 ENCOUNTER — Ambulatory Visit (INDEPENDENT_AMBULATORY_CARE_PROVIDER_SITE_OTHER): Payer: 59 | Admitting: Emergency Medicine

## 2022-12-07 ENCOUNTER — Telehealth: Payer: Self-pay | Admitting: Gastroenterology

## 2022-12-07 ENCOUNTER — Encounter: Payer: Self-pay | Admitting: Emergency Medicine

## 2022-12-07 ENCOUNTER — Encounter (HOSPITAL_COMMUNITY): Payer: Self-pay | Admitting: Gastroenterology

## 2022-12-07 VITALS — BP 118/70 | HR 90 | Temp 98.5°F | Ht 65.0 in | Wt 170.1 lb

## 2022-12-07 DIAGNOSIS — K5790 Diverticulosis of intestine, part unspecified, without perforation or abscess without bleeding: Secondary | ICD-10-CM | POA: Diagnosis not present

## 2022-12-07 DIAGNOSIS — D500 Iron deficiency anemia secondary to blood loss (chronic): Secondary | ICD-10-CM | POA: Diagnosis not present

## 2022-12-07 DIAGNOSIS — K648 Other hemorrhoids: Secondary | ICD-10-CM | POA: Diagnosis not present

## 2022-12-07 DIAGNOSIS — K922 Gastrointestinal hemorrhage, unspecified: Secondary | ICD-10-CM

## 2022-12-07 LAB — CBC WITH DIFFERENTIAL/PLATELET
Basophils Absolute: 0.1 10*3/uL (ref 0.0–0.1)
Basophils Relative: 1.2 % (ref 0.0–3.0)
Eosinophils Absolute: 0.1 10*3/uL (ref 0.0–0.7)
Eosinophils Relative: 2.2 % (ref 0.0–5.0)
HCT: 29.8 % — ABNORMAL LOW (ref 39.0–52.0)
Hemoglobin: 8.9 g/dL — ABNORMAL LOW (ref 13.0–17.0)
Lymphocytes Relative: 27.8 % (ref 12.0–46.0)
Lymphs Abs: 1.5 10*3/uL (ref 0.7–4.0)
MCHC: 30 g/dL (ref 30.0–36.0)
MCV: 67.5 fl — ABNORMAL LOW (ref 78.0–100.0)
Monocytes Absolute: 0.4 10*3/uL (ref 0.1–1.0)
Monocytes Relative: 6.6 % (ref 3.0–12.0)
Neutro Abs: 3.3 10*3/uL (ref 1.4–7.7)
Neutrophils Relative %: 62.2 % (ref 43.0–77.0)
Platelets: 323 10*3/uL (ref 150.0–400.0)
RBC: 4.39 Mil/uL (ref 4.22–5.81)
RDW: 22.6 % — ABNORMAL HIGH (ref 11.5–15.5)
WBC: 5.4 10*3/uL (ref 4.0–10.5)

## 2022-12-07 LAB — COMPREHENSIVE METABOLIC PANEL
ALT: 14 U/L (ref 0–53)
AST: 16 U/L (ref 0–37)
Albumin: 4.1 g/dL (ref 3.5–5.2)
Alkaline Phosphatase: 98 U/L (ref 39–117)
BUN: 12 mg/dL (ref 6–23)
CO2: 28 mEq/L (ref 19–32)
Calcium: 8.8 mg/dL (ref 8.4–10.5)
Chloride: 102 mEq/L (ref 96–112)
Creatinine, Ser: 0.82 mg/dL (ref 0.40–1.50)
GFR: 98.9 mL/min (ref >60.00)
Glucose, Bld: 115 mg/dL — ABNORMAL HIGH (ref 70–99)
Potassium: 4.1 mEq/L (ref 3.5–5.1)
Sodium: 138 mEq/L (ref 135–145)
Total Bilirubin: 0.3 mg/dL (ref 0.2–1.2)
Total Protein: 7.4 g/dL (ref 6.0–8.3)

## 2022-12-07 NOTE — Telephone Encounter (Signed)
Patient came in stating he has Endo appt a the [facility] 12/14/22.Marland Kitchen Patient states he was recently admitted into the hospital the procedure (EGD) was done. Patient wants to be advised on returning for scheduled EGD procedure on 12/14/22... Thank you

## 2022-12-07 NOTE — Assessment & Plan Note (Signed)
Secondary to both diverticulosis and internal hemorrhoids. Had capsule study to evaluate small intestines.  Told it was normal.

## 2022-12-07 NOTE — Patient Instructions (Signed)
Diverticulosis Diverticulosis  La diverticulosis se produce cuando se forman unas pequeas bolsas llamadas divertculos en la pared del colon. El colon es el lugar donde se absorbe el agua. Tambin es Immunologist donde se forma la materia fecal (heces). Las bolsas se forman cuando la capa interna del colon ejerce presin sobre los puntos dbiles de las capas externas. Puede tener unas pocas bolsas o muchas. En la [employer], estas bolsas no causan problemas. Si se inflaman o infectan, usted puede padecer una enfermedad denominada "diverticulitis". Cules son las causas? Se desconoce la causa de esta afeccin. Qu incrementa el riesgo? Es ms probable que tenga esta afeccin si: Es mayor de 60 aos. No consume suficiente fibra o sufre estreimiento con mucha frecuencia. Tiene sobrepeso. No realiza suficiente actividad fsica. Fuma. Toma analgsicos sin receta. Tiene antecedentes familiares de la afeccin. Cules son los signos o sntomas? La mayora de las personas no tiene sntomas. Si tiene sntomas, pueden incluir los siguientes: Meteorismo. Clicos intestinales. Estreimiento o diarrea. Dolor en la parte inferior izquierda del abdomen. Cmo se diagnostica? Con frecuencia, esta afeccin se diagnostica durante un examen para detectar otros problemas del colon. Puede diagnosticarse cuando se realiza: Una colonoscopa. Este es un estudio en el que se [place] un tubo con una cmara en el extremo para observar el colon. Un enema de bario. Este es un examen radiogrfico en el que se [place] un tinte para observar el colon. Una exploracin por tomografa computarizada (TC). Cmo se trata? Puede que no necesite tratamiento. Su mdico le dir Programmer, systems en casa para Physiological scientist. Es posible que necesite tratamiento si tiene sntomas o si ha tenido diverticulitis antes. Es posible que le indiquen lo siguiente: [place] alimentos ricos Lobbyist. Tomar medicamentos para relajar el  colon. Adelgazar. Siga estas instrucciones en su casa: Medicamentos Use los medicamentos de venta libre y los recetados solamente como se lo haya indicado el mdico. Si se lo indican, tome un suplemento con fibras o probitico. Control del estreimiento Su afeccin puede causar estreimiento. Para prevenir o tratar el estreimiento, es posible que deba hacer lo siguiente: Beber suficiente lquido para Radio producer pis (orina) de color amarillo plido. Usar medicamentos recetados o de Sales promotion account executive. Consumir alimentos ricos en fibra, como frijoles, cereales integrales, y frutas y verduras frescas. Limitar el consumo de alimentos ricos en grasa y azcares procesados, como los alimentos fritos o dulces. Trate de no hacer fuerza cuando defeque. Comunquese con un mdico si: Los sntomas empeoran de [place] repentina. Tiene dolor abdominal que empeora. Tiene hinchazn o clicos estomacales. Sigue teniendo estreimiento con frecuencia. Tiene fiebre o escalofros. Vomita. Sus heces tienen sangre, aspecto alquitranado o son de color negro. Esta informacin no tiene Theme park manager el consejo del mdico. Asegrese de hacerle al mdico cualquier pregunta que tenga. Document Revised: 04/29/2022 Document Reviewed: 04/29/2022 Elsevier Patient Education  2024 [employer].

## 2022-12-07 NOTE — Assessment & Plan Note (Signed)
Contributing to chronic GI bleed Diet and nutrition discussed Advised to increase amount of fiber in his diet stay well-hydrated Constipation advice given

## 2022-12-07 NOTE — Assessment & Plan Note (Signed)
Clinically stable.  Not actively bleeding today. No red flag signs or symptoms.  Normal vital signs and benign abdominal examination ED precautions given Needs GI follow-up Blood work done today Diet and nutrition discussed

## 2022-12-07 NOTE — Assessment & Plan Note (Signed)
Chronic, intermittent, and slow. Needs follow-up with GI May need repeat colonoscopy Clinically stable.  No red flag signs or symptoms

## 2022-12-07 NOTE — Progress Notes (Signed)
Troy Stevenson 57 y.o.   Chief Complaint  Patient presents with   Hospitalization Follow-up    Patient was in the hosp on 5/22 d/c 5/25, went to the UC in 5/29  Patient states since being D/c from hospital he still feels the same as before     HISTORY OF PRESENT ILLNESS: This is a 57 y.o. male here for hospital discharge follow-up.  Admitted on 11/25/2022 and discharged 11/28/2022 after he was admitted with lower GI bleed and significant anemia Workup showed diverticulosis and internal hemorrhoids Feels better today.  No other complaints or medical concerns today. Hospital discharge summary as follows: Physician Discharge Summary  Patient ID: Troy Stevenson MRN: [id number] DOB/AGE: [birth year] 57 y.o.   Admit date: 08/19/2022 Discharge date: 08/23/2022   Admission Diagnoses:   Discharge Diagnoses:  Principal Problem:   Symptomatic anemia Active Problems:   GI bleed   Iron deficiency anemia   Fundic gland polyposis of stomach     Discharged Condition: stable   Hospital Course: Patient is a 57 year old Hispanic male with medical history significant for GI bleeds, colonic polyps s/p polypectomy and internal hemorrhoids.  Patient was admitted with rectal bleed and symptomatic anemia.  On presentation, patient's hemoglobin was 5.8 g/dL.  Patient was admitted for further assessment and management.  Patient was transfused with 2 units of packed red blood cells.  GI team was consulted to assist with patient's management.  Patient underwent EGD and colonoscopy.  Colonoscopy revealed nonbleeding internal hemorrhoids and sigmoid and descending colon diverticula.  Upper GI scope revealed normal esophagus and multiple gastric polyps.  Polyps were extracted and sent for pathology.  Capsule endoscopy was recommended by GI team but patient was worried about swallowing endoscopy capsule.  Patient will follow-up with GI team on outpatient basis to address option of capsule endoscopy.  CT  enterography was negative.  Patient has severe iron deficiency.  Hemoglobin today is 8.3 g/dL.  Patient will be discharged to the care of the primary care provider and the GI team.  Iron was 17.  IV iron infusion during the hospital stay.  We defer further management of iron deficiency to the primary care provider and GI team.     Symptomatic anemia Iron deficiency anemia History of GI bleeding-suspect chronic GI bleed gerd: Transfused 2 units PRBC> hh stable. Cont PPI.GI was consulted.  Patient underwent upper GI scope and colonoscopy.  Iron infusion for severe iron deficiency.  Unintentional weight loss 15 pounds over the past 1 to 5-month: See above.  plan as per GI   Consults: GI   Significant Diagnostic Studies:  Upper GI scope and colonoscopy (see above documentation).      HPI   Prior to Admission medications   Medication Sig Start Date End Date Taking? Authorizing Provider  amoxicillin-clavulanate (AUGMENTIN) 875-125 MG tablet Take 1 tablet by mouth every 12 (twelve) hours. 12/02/22  Yes Carlisle Beers, FNP  B Complex Vitamins (VITAMIN B COMPLEX) TABS Take 1 tablet by mouth daily.   Yes [provider]  CALCIUM PO Take 1 tablet by mouth daily.   Yes [provider]  ferrous sulfate 325 (65 FE) MG EC tablet Take 1 tablet (325 mg total) by mouth 2 (two) times daily. 11/28/22 02/26/23 Yes [place], Eric J, DO  pantoprazole (PROTONIX) 40 MG tablet Take 1 tablet (40 mg total) by mouth 2 (two) times daily. 11/28/22 01/27/23 Yes [place], Eric J, DO  sucralfate (CARAFATE) 1 g tablet Take 1 tablet (1 g  total) by mouth 2 (two) times daily for 7 days. 11/28/22 12/05/22  [place], Eric J, DO    No Known Allergies  Patient Active Problem List   Diagnosis Date Noted   Lower GI bleed 11/26/2022   GI bleed 11/25/2022   Iron deficiency anemia 08/21/2022   Fundic gland polyposis of stomach 08/21/2022   Symptomatic anemia 08/19/2022   Chronic anxiety 02/24/2013    Past  Medical History:  Diagnosis Date   Anemia Aug 19 2022   Anxiety    Blood transfusion without reported diagnosis Feb14 2024   Hepatitis     Past Surgical History:  Procedure Laterality Date   BIOPSY  08/21/2022   Procedure: BIOPSY;  Surgeon: Jenel Lucks, MD;  Location: [facility] ENDOSCOPY;  Service: Gastroenterology;;   COLONOSCOPY WITH PROPOFOL N/A 08/21/2022   Procedure: COLONOSCOPY WITH PROPOFOL;  Surgeon: Jenel Lucks, MD;  Location: [facility] ENDOSCOPY;  Service: Gastroenterology;  Laterality: N/A;   ESOPHAGOGASTRODUODENOSCOPY N/A 11/27/2022   Procedure: ESOPHAGOGASTRODUODENOSCOPY (EGD);  Surgeon: Napoleon Form, MD;  Location: [facility] ENDOSCOPY;  Service: Gastroenterology;  Laterality: N/A;   ESOPHAGOGASTRODUODENOSCOPY (EGD) WITH PROPOFOL N/A 08/21/2022   Procedure: ESOPHAGOGASTRODUODENOSCOPY (EGD) WITH PROPOFOL;  Surgeon: Jenel Lucks, MD;  Location: [facility] ENDOSCOPY;  Service: Gastroenterology;  Laterality: N/A;   GIVENS CAPSULE STUDY N/A 11/27/2022   Procedure: GIVENS CAPSULE STUDY;  Surgeon: Napoleon Form, MD;  Location: [facility] ENDOSCOPY;  Service: Gastroenterology;  Laterality: N/A;   MALONEY DILATION  11/27/2022   Procedure: MALONEY DILATION;  Surgeon: Napoleon Form, MD;  Location: [facility] ENDOSCOPY;  Service: Gastroenterology;;  55   POLYPECTOMY  08/21/2022   Procedure: POLYPECTOMY;  Surgeon: Jenel Lucks, MD;  Location: [facility] ENDOSCOPY;  Service: Gastroenterology;;    Social History   Socioeconomic History   Marital status: Divorced    Spouse name: Not on file   Number of children: 1   Years of education: Not on file   Highest education level: 11th grade  Occupational History   Not on file  Tobacco Use   Smoking status: Never   Smokeless tobacco: Never  Substance and Sexual Activity   Alcohol use: Not Currently    Alcohol/week: 20.0 standard drinks of alcohol    Types: 20 Cans of beer per week    Comment: 4-5 beers daily   Drug use: Never   Sexual  activity: Not Currently  Other Topics Concern   Not on file  Social History Narrative   Not on file   Social Determinants of Health   Financial Resource Strain: Low Risk  (12/07/2022)   Overall Financial Resource Strain (CARDIA)    Difficulty of Paying Living Expenses: Not very hard  Food Insecurity: No Food Insecurity (12/07/2022)   Hunger Vital Sign    Worried About Running Out of Food in the Last Year: Never true    Ran Out of Food in the Last Year: Never true  Transportation Needs: No Transportation Needs (12/07/2022)   PRAPARE - Administrator, Civil Service (Medical): No    Lack of Transportation (Non-Medical): No  Physical Activity: Inactive (12/07/2022)   Exercise Vital Sign    Days of Exercise per Week: 1 day    Minutes of Exercise per Session: 0 min  Stress: Stress Concern Present (12/07/2022)   [employer] of Occupational Health - Occupational Stress Questionnaire    Feeling of Stress : To some extent  Social Connections: Moderately Isolated (12/07/2022)   Social Connection and Isolation Panel [NHANES]  Frequency of Communication with Friends and Family: Three times a week    Frequency of Social Gatherings with Friends and Family: Once a week    Attends Religious Services: 1 to 4 times per year    Active Member of [employer] or Organizations: No    Attends Engineer, structural: Not on file    Marital Status: Divorced  Intimate Partner Violence: Not At Risk (11/25/2022)   Humiliation, Afraid, Rape, and Kick questionnaire    Fear of Current or Ex-Partner: No    Emotionally Abused: No    Physically Abused: No    Sexually Abused: No    Family History  Problem Relation Age of Onset   Hypertension Father    Diabetes Father    Colon cancer Neg Hx    Rectal cancer Neg Hx      Review of Systems  Constitutional: Negative.  Negative for chills and fever.  HENT: Negative.  Negative for congestion and sore throat.   Respiratory: Negative.  Negative for  cough and shortness of breath.   Cardiovascular: Negative.  Negative for chest pain and palpitations.  Gastrointestinal:  Negative for abdominal pain, diarrhea, nausea and vomiting.  Genitourinary: Negative.  Negative for dysuria and hematuria.  Skin: Negative.  Negative for rash.  Neurological: Negative.  Negative for dizziness and headaches.  All other systems reviewed and are negative.   Vitals:   12/07/22 1535  BP: 118/70  Pulse: 90  Temp: 98.5 F (36.9 C)  SpO2: 96%    Physical Exam Vitals reviewed.  Constitutional:      Appearance: Normal appearance.  HENT:     Head: Normocephalic.     Mouth/Throat:     Mouth: Mucous membranes are moist.     Pharynx: Oropharynx is clear.  Eyes:     Extraocular Movements: Extraocular movements intact.     Conjunctiva/sclera: Conjunctivae normal.     Pupils: Pupils are equal, round, and reactive to light.  Cardiovascular:     Rate and Rhythm: Normal rate and regular rhythm.     Pulses: Normal pulses.     Heart sounds: Normal heart sounds.  Pulmonary:     Effort: Pulmonary effort is normal.     Breath sounds: Normal breath sounds.  Abdominal:     Palpations: Abdomen is soft.     Tenderness: There is no abdominal tenderness.  Musculoskeletal:     Cervical back: No tenderness.  Lymphadenopathy:     Cervical: No cervical adenopathy.  Skin:    General: Skin is warm and dry.     Capillary Refill: Capillary refill takes less than 2 seconds.  Neurological:     General: No focal deficit present.     Mental Status: He is alert and oriented to person, place, and time.  Psychiatric:        Mood and Affect: Mood normal.        Behavior: Behavior normal.      ASSESSMENT & PLAN: A total of 47 minutes was spent with the patient and counseling/coordination of care regarding preparing for this visit, review of most recent office visit notes, review of most recent hospital discharge summary, review of most recent blood work results, review  of most recent upper and lower endoscopy reports, education on nutrition, review of all medications, prognosis, documentation and need for follow-up.  Problem List Items Addressed This Visit       Cardiovascular and Mediastinum   Internal hemorrhoids    Contributing to chronic GI blood  loss      Relevant Orders   CBC with Differential/Platelet   Comprehensive metabolic panel     Digestive   Chronic GI bleeding    Secondary to both diverticulosis and internal hemorrhoids. Had capsule study to evaluate small intestines.  Told it was normal.      Relevant Orders   CBC with Differential/Platelet   Comprehensive metabolic panel   Lower GI bleed    Chronic, intermittent, and slow. Needs follow-up with GI May need repeat colonoscopy Clinically stable.  No red flag signs or symptoms      Diverticulosis    Contributing to chronic GI bleed Diet and nutrition discussed Advised to increase amount of fiber in his diet stay well-hydrated Constipation advice given      Relevant Orders   CBC with Differential/Platelet   Comprehensive metabolic panel     Other   Chronic blood loss anemia - Primary    Clinically stable.  Not actively bleeding today. No red flag signs or symptoms.  Normal vital signs and benign abdominal examination ED precautions given Needs GI follow-up Blood work done today Diet and nutrition discussed      Relevant Orders   CBC with Differential/Platelet   Comprehensive metabolic panel   Patient Instructions  Diverticulosis Diverticulosis  La diverticulosis se produce cuando se forman unas pequeas bolsas llamadas divertculos en la pared del colon. El colon es el lugar donde se absorbe el agua. Tambin es Immunologist donde se forma la materia fecal (heces). Las bolsas se forman cuando la capa interna del colon ejerce presin sobre los puntos dbiles de las capas externas. Puede tener unas pocas bolsas o muchas. En la [employer], estas bolsas no  causan problemas. Si se inflaman o infectan, usted puede padecer una enfermedad denominada "diverticulitis". Cules son las causas? Se desconoce la causa de esta afeccin. Qu incrementa el riesgo? Es ms probable que tenga esta afeccin si: Es mayor de 60 aos. No consume suficiente fibra o sufre estreimiento con mucha frecuencia. Tiene sobrepeso. No realiza suficiente actividad fsica. Fuma. Toma analgsicos sin receta. Tiene antecedentes familiares de la afeccin. Cules son los signos o sntomas? La mayora de las personas no tiene sntomas. Si tiene sntomas, pueden incluir los siguientes: Meteorismo. Clicos intestinales. Estreimiento o diarrea. Dolor en la parte inferior izquierda del abdomen. Cmo se diagnostica? Con frecuencia, esta afeccin se diagnostica durante un examen para detectar otros problemas del colon. Puede diagnosticarse cuando se realiza: Una colonoscopa. Este es un estudio en el que se [place] un tubo con una cmara en el extremo para observar el colon. Un enema de bario. Este es un examen radiogrfico en el que se [place] un tinte para observar el colon. Una exploracin por tomografa computarizada (TC). Cmo se trata? Puede que no necesite tratamiento. Su mdico le dir Programmer, systems en casa para Physiological scientist. Es posible que necesite tratamiento si tiene sntomas o si ha tenido diverticulitis antes. Es posible que le indiquen lo siguiente: [place] alimentos ricos Lobbyist. Tomar medicamentos para relajar el colon. Adelgazar. Siga estas instrucciones en su casa: Medicamentos Use los medicamentos de venta libre y los recetados solamente como se lo haya indicado el mdico. Si se lo indican, tome un suplemento con fibras o probitico. Control del estreimiento Su afeccin puede causar estreimiento. Para prevenir o tratar el estreimiento, es posible que deba hacer lo siguiente: Beber suficiente lquido para Radio producer pis (orina) de color amarillo  plido. Usar medicamentos recetados o de  venta libre. Consumir alimentos ricos en fibra, como frijoles, cereales integrales, y frutas y verduras frescas. Limitar el consumo de alimentos ricos en grasa y azcares procesados, como los alimentos fritos o dulces. Trate de no hacer fuerza cuando defeque. Comunquese con un mdico si: Los sntomas empeoran de [place] repentina. Tiene dolor abdominal que empeora. Tiene hinchazn o clicos estomacales. Sigue teniendo estreimiento con frecuencia. Tiene fiebre o escalofros. Vomita. Sus heces tienen sangre, aspecto alquitranado o son de color negro. Esta informacin no tiene Theme park manager el consejo del mdico. Asegrese de hacerle al mdico cualquier pregunta que tenga. Document Revised: 04/29/2022 Document Reviewed: 04/29/2022 Elsevier Patient Education  2024 Elsevier Inc.     Edwina Barth, MD [facility]

## 2022-12-07 NOTE — Telephone Encounter (Signed)
Pt was in the hospital and had egd done 5/24 with Dr. Lavon Paganini. He is scheduled for an Egd at the hospital with you on 6/10. Pt wanted to know if he needs to keep this appt. Please advise.

## 2022-12-07 NOTE — Assessment & Plan Note (Signed)
Contributing to chronic GI blood loss

## 2022-12-09 NOTE — Telephone Encounter (Signed)
Hospital is calling to see if pt is having procedure tomorrow. Please advise.

## 2022-12-10 ENCOUNTER — Encounter (HOSPITAL_COMMUNITY): Payer: Self-pay

## 2022-12-10 ENCOUNTER — Encounter (HOSPITAL_COMMUNITY): Payer: Self-pay | Admitting: Gastroenterology

## 2022-12-10 ENCOUNTER — Emergency Department (HOSPITAL_COMMUNITY)
Admission: EM | Admit: 2022-12-10 | Discharge: 2022-12-10 | Disposition: A | Discharge status: Home / Self Care | Payer: 59 | Attending: Emergency Medicine | Admitting: Emergency Medicine

## 2022-12-10 DIAGNOSIS — K921 Melena: Secondary | ICD-10-CM | POA: Diagnosis not present

## 2022-12-10 DIAGNOSIS — R531 Weakness: Secondary | ICD-10-CM | POA: Diagnosis not present

## 2022-12-10 DIAGNOSIS — R5383 Other fatigue: Secondary | ICD-10-CM | POA: Diagnosis not present

## 2022-12-10 LAB — TYPE AND SCREEN
ABO/RH(D): O POS
Antibody Screen: NEGATIVE

## 2022-12-10 LAB — CBC WITH DIFFERENTIAL/PLATELET
Abs Immature Granulocytes: 0.02 10*3/uL (ref 0.00–0.07)
Basophils Absolute: 0.1 10*3/uL (ref 0.0–0.1)
Basophils Relative: 1 %
Eosinophils Absolute: 0.1 10*3/uL (ref 0.0–0.5)
Eosinophils Relative: 2 %
HCT: 30.3 % — ABNORMAL LOW (ref 39.0–52.0)
Hemoglobin: 8.6 g/dL — ABNORMAL LOW (ref 13.0–17.0)
Immature Granulocytes: 1 %
Lymphocytes Relative: 31 %
Lymphs Abs: 1.3 10*3/uL (ref 0.7–4.0)
MCH: 20.5 pg — ABNORMAL LOW (ref 26.0–34.0)
MCHC: 28.4 g/dL — ABNORMAL LOW (ref 30.0–36.0)
MCV: 72.1 fL — ABNORMAL LOW (ref 80.0–100.0)
Monocytes Absolute: 0.4 10*3/uL (ref 0.1–1.0)
Monocytes Relative: 9 %
Neutro Abs: 2.5 10*3/uL (ref 1.7–7.7)
Neutrophils Relative %: 56 %
Platelets: 383 10*3/uL (ref 150–400)
RBC: 4.2 MIL/uL — ABNORMAL LOW (ref 4.22–5.81)
RDW: 22.2 % — ABNORMAL HIGH (ref 11.5–15.5)
WBC: 4.4 10*3/uL (ref 4.0–10.5)
nRBC: 0 % (ref 0.0–0.2)

## 2022-12-10 LAB — BASIC METABOLIC PANEL
Anion gap: 7 (ref 5–15)
BUN: 11 mg/dL (ref 6–20)
CO2: 25 mmol/L (ref 22–32)
Calcium: 8.6 mg/dL — ABNORMAL LOW (ref 8.9–10.3)
Chloride: 104 mmol/L (ref 98–111)
Creatinine, Ser: 0.89 mg/dL (ref 0.61–1.24)
GFR, Estimated: >60 mL/min (ref >60)
Glucose, Bld: 127 mg/dL — ABNORMAL HIGH (ref 70–99)
Potassium: 3.9 mmol/L (ref 3.5–5.1)
Sodium: 136 mmol/L (ref 135–145)

## 2022-12-10 NOTE — ED Triage Notes (Signed)
Pt presents with c/o rectal bleeding. Pt reports he recently had a procedure done and has had some rectal bleeding on and off since then, now dark red in nature. Pt reports a hx of this same issue.

## 2022-12-10 NOTE — ED Provider Notes (Signed)
EMERGENCY DEPARTMENT AT [facility] Provider Note   CSN: [id number] Arrival date & time: 12/10/22  1240     History  Chief Complaint  Patient presents with   Rectal Bleeding    Troy Stevenson is a 57 y.o. male.  Patient with history of iron deficiency, recent GI bleed for which she was admitted and underwent GI workup including endoscopy, colonoscopy.  He also underwent capsule endoscopy which was completed but patient states they were unable to identify anything.  He is due to undergo capsule endoscopy on 6/10.  He states today he noticed some fatigue, weakness.  He is unsure if this is because he did not sleep well last night, or has been really busy as of late.  He states since being discharged he has had blood in the stool with every bowel movement.  He states for the past 3 days however this is turned close to black in color which is new.  He has not notified his gastroenterologist about this.  No hematemesis.  Denies alcohol abuse, or NSAID use.  The history is provided by the patient. No language interpreter was used.       Home Medications Prior to Admission medications   Medication Sig Start Date End Date Taking? Authorizing Provider  amoxicillin-clavulanate (AUGMENTIN) 875-125 MG tablet Take 1 tablet by mouth every 12 (twelve) hours. 12/02/22   Carlisle Beers, FNP  B Complex Vitamins (VITAMIN B COMPLEX) TABS Take 1 tablet by mouth daily.    [provider]  CALCIUM PO Take 1 tablet by mouth daily.    [provider]  ferrous sulfate 325 (65 FE) MG EC tablet Take 1 tablet (325 mg total) by mouth 2 (two) times daily. 11/28/22 02/26/23  [place], Alvira Philips, DO  pantoprazole (PROTONIX) 40 MG tablet Take 1 tablet (40 mg total) by mouth 2 (two) times daily. 11/28/22 01/27/23  [place], Alvira Philips, DO  sucralfate (CARAFATE) 1 g tablet Take 1 tablet (1 g total) by mouth 2 (two) times daily for 7 days. 11/28/22 12/05/22  [place], Eric J, DO       Allergies    Patient has no known allergies.    Review of Systems   Review of Systems  Constitutional:  Positive for fatigue. Negative for fever.  Gastrointestinal:  Positive for blood in stool. Negative for abdominal pain, nausea and vomiting.  Genitourinary:  Negative for dysuria.  Neurological:  Positive for weakness and light-headedness.  All other systems reviewed and are negative.   Physical Exam Updated Vital Signs BP 132/66 (BP Location: Left Arm)   Pulse 91   Temp 97.8 F (36.6 C) (Oral)   Resp 17   SpO2 100%  Physical Exam Vitals and nursing note reviewed.  Constitutional:      General: He is not in acute distress.    Appearance: Normal appearance. He is not ill-appearing.  HENT:     Head: Normocephalic and atraumatic.     Nose: Nose normal.  Eyes:     General: No scleral icterus.    Extraocular Movements: Extraocular movements intact.     Conjunctiva/sclera: Conjunctivae normal.  Cardiovascular:     Rate and Rhythm: Normal rate and regular rhythm.     Heart sounds: Normal heart sounds.  Pulmonary:     Effort: Pulmonary effort is normal. No respiratory distress.     Breath sounds: Normal breath sounds. No wheezing or rales.  Abdominal:     General: There is no distension.  Tenderness: There is no abdominal tenderness.  Musculoskeletal:        General: Normal range of motion.     Cervical back: Normal range of motion.  Skin:    General: Skin is warm and dry.  Neurological:     General: No focal deficit present.     Mental Status: He is alert. Mental status is at baseline.     ED Results / Procedures / Treatments   Labs (all labs ordered are listed, but only abnormal results are displayed) Labs Reviewed  CBC WITH DIFFERENTIAL/PLATELET - Abnormal; Notable for the following components:      Result Value   RBC 4.20 (*)    Hemoglobin 8.6 (*)    HCT 30.3 (*)    MCV 72.1 (*)    MCH 20.5 (*)    MCHC 28.4 (*)    RDW 22.2 (*)    All other  components within normal limits  BASIC METABOLIC PANEL  TYPE AND SCREEN    EKG None  Radiology No results found.  Procedures Procedures    Medications Ordered in ED Medications - No data to display  ED Course/ Medical Decision Making/ A&P                             Medical Decision Making Amount and/or Complexity of Data Reviewed Labs: ordered.   Patient with history of iron deficiency anemia recently admitted for GI bleed and undergone GI workup presents today for concern of weakness and fatigue since today.  He states he has not had decent sleep in the past few nights, and has been really busy at work.  He is unsure if this is because he has a drop in hemoglobin, because of the activity change that is causing his symptoms.  He denies abdominal pain.  He is taking an iron supplement which is new since his recent discharge.  Recently had change in his stool color to dark/black.  He does have history of hemorrhoids, and diverticulosis.  No source of bleeding was identified on recent workup.  He is due to undergo capsule endoscopy on 6/10.  He is concerned last time he did not have a good prep.  CBC shows no leukocytosis, hemoglobin of 8.6 which is slightly decreased from 8.9 from 3 days ago.  No indication for transfusion.  I did offer patient discussion with gastroenterology for potential admission for inpatient workup.  However he states he would rather wait until Monday to have his capsule endoscopy as scheduled.  He will return if he has any worsening or concerning symptoms.  Feel this is reasonable as his hemoglobin is relatively stable.  The change in stool color could be the new iron supplement.  Given he prefers not to stay for inpatient workup will defer discussion with gastroenterology.  He is appropriate for discharge.  Discharged in stable condition.  Return precautions discussed.  BMP with glucose of 127 otherwise unremarkable. Final Clinical Impression(s) / ED  Diagnoses Final diagnoses:  Melanotic stools    Rx / DC Orders ED Discharge Orders     None         Marita [place], PA-C 12/10/22 1408    Linwood Dibbles, MD 12/11/22 [phone]

## 2022-12-10 NOTE — Discharge Instructions (Addendum)
Your hemoglobin today was 8.6.  This is slightly lower than your most recent from 3 days ago which was 8.9.  Dark stools could be due to your iron supplement.  Follow-up with your gastroenterologist.  You have a capsule endoscopy scheduled for Monday.  Ensure you keep this appointment.  For any worrisome symptoms return to the emergency room.  He states rather than staying he rather have this done on Monday as scheduled.

## 2022-12-10 NOTE — Telephone Encounter (Signed)
Spoke with pt and he is aware that he needs to keep the appt. Prep instructions sent to pt via mychart.

## 2022-12-13 NOTE — Anesthesia Preprocedure Evaluation (Signed)
Anesthesia Evaluation  Patient identified by MRN, date of birth, ID band Patient awake    Reviewed: Allergy & Precautions, NPO status , Patient's Chart, lab work & pertinent test results  History of Anesthesia Complications Negative for: history of anesthetic complications  Airway Mallampati: II  TM Distance: >3 FB Neck ROM: Full    Dental no notable dental hx.    Pulmonary neg pulmonary ROS   Pulmonary exam normal        Cardiovascular negative cardio ROS Normal cardiovascular exam     Neuro/Psych   Anxiety        GI/Hepatic negative GI ROS,,,(+) Hepatitis -  Endo/Other  negative endocrine ROS    Renal/GU negative Renal ROS     Musculoskeletal negative musculoskeletal ROS (+)    Abdominal   Peds  Hematology  (+) Blood dyscrasia (Hgb 8.6), anemia   Anesthesia Other Findings Day of surgery medications reviewed with patient.  Reproductive/Obstetrics                              Anesthesia Physical Anesthesia Plan  ASA: 3  Anesthesia Plan: MAC   Post-op Pain Management: Minimal or no pain anticipated   Induction:   PONV Risk Score and Plan: Treatment may vary due to age or medical condition and Propofol infusion  Airway Management Planned: Natural Airway and Nasal Cannula  Additional Equipment: None  Intra-op Plan:   Post-operative Plan:   Informed Consent: I have reviewed the patients History and Physical, chart, labs and discussed the procedure including the risks, benefits and alternatives for the proposed anesthesia with the patient or authorized representative who has indicated his/her understanding and acceptance.       Plan Discussed with: CRNA  Anesthesia Plan Comments:          Anesthesia Quick Evaluation

## 2022-12-14 ENCOUNTER — Ambulatory Visit (HOSPITAL_BASED_OUTPATIENT_CLINIC_OR_DEPARTMENT_OTHER): Payer: 59 | Admitting: Anesthesiology

## 2022-12-14 ENCOUNTER — Encounter (HOSPITAL_COMMUNITY): Payer: Self-pay | Admitting: Gastroenterology

## 2022-12-14 ENCOUNTER — Ambulatory Visit (HOSPITAL_COMMUNITY): Payer: 59 | Admitting: Anesthesiology

## 2022-12-14 ENCOUNTER — Encounter (HOSPITAL_COMMUNITY)
Admission: RE | Disposition: A | Discharge status: Home / Self Care | Payer: Self-pay | Source: Home / Self Care | Attending: Gastroenterology

## 2022-12-14 ENCOUNTER — Ambulatory Visit (HOSPITAL_COMMUNITY)
Admission: RE | Admit: 2022-12-14 | Discharge: 2022-12-14 | Disposition: A | Discharge status: Home / Self Care | Payer: 59 | Attending: Gastroenterology | Admitting: Gastroenterology

## 2022-12-14 DIAGNOSIS — D509 Iron deficiency anemia, unspecified: Secondary | ICD-10-CM

## 2022-12-14 DIAGNOSIS — D759 Disease of blood and blood-forming organs, unspecified: Secondary | ICD-10-CM | POA: Diagnosis not present

## 2022-12-14 DIAGNOSIS — F419 Anxiety disorder, unspecified: Secondary | ICD-10-CM | POA: Diagnosis not present

## 2022-12-14 DIAGNOSIS — K317 Polyp of stomach and duodenum: Secondary | ICD-10-CM

## 2022-12-14 DIAGNOSIS — K759 Inflammatory liver disease, unspecified: Secondary | ICD-10-CM | POA: Diagnosis not present

## 2022-12-14 HISTORY — PX: POLYPECTOMY: SHX5525

## 2022-12-14 HISTORY — PX: ESOPHAGOGASTRODUODENOSCOPY (EGD) WITH PROPOFOL: SHX5813

## 2022-12-14 HISTORY — PX: GIVENS CAPSULE STUDY: SHX5432

## 2022-12-14 SURGERY — ESOPHAGOGASTRODUODENOSCOPY (EGD) WITH PROPOFOL
Anesthesia: Monitor Anesthesia Care

## 2022-12-14 MED ORDER — SODIUM CHLORIDE 0.9 % IV SOLN: INTRAVENOUS | Status: DC

## 2022-12-14 MED ORDER — PHENYLEPHRINE HCL (PRESSORS) 10 MG/ML IV SOLN
INTRAVENOUS | Status: DC | PRN
  Administered 2022-12-14 (×4): 160 ug via INTRAVENOUS

## 2022-12-14 MED ORDER — PROPOFOL 500 MG/50ML IV EMUL
INTRAVENOUS | Status: DC | PRN
  Administered 2022-12-14: 150 ug/kg/min via INTRAVENOUS

## 2022-12-14 MED ORDER — LACTATED RINGERS IV SOLN: INTRAVENOUS | Status: DC

## 2022-12-14 MED ORDER — PROPOFOL 500 MG/50ML IV EMUL
INTRAVENOUS | Status: AC
  Filled 2022-12-14: qty 50

## 2022-12-14 MED ORDER — PROPOFOL 10 MG/ML IV BOLUS
INTRAVENOUS | Status: AC
  Filled 2022-12-14: qty 20

## 2022-12-14 MED ORDER — LIDOCAINE HCL (CARDIAC) PF 100 MG/5ML IV SOSY
PREFILLED_SYRINGE | INTRAVENOUS | Status: DC | PRN
  Administered 2022-12-14: 60 mg via INTRAVENOUS

## 2022-12-14 MED ORDER — PROPOFOL 10 MG/ML IV BOLUS
INTRAVENOUS | Status: DC | PRN
  Administered 2022-12-14 (×2): 30 mg via INTRAVENOUS

## 2022-12-14 SURGICAL SUPPLY — 15 items

## 2022-12-14 NOTE — Anesthesia Postprocedure Evaluation (Signed)
Anesthesia Post Note  Patient: Troy Stevenson  Procedure(s) Performed: ESOPHAGOGASTRODUODENOSCOPY (EGD) WITH PROPOFOL GIVENS CAPSULE STUDY POLYPECTOMY     Patient location during evaluation: PACU Anesthesia Type: MAC Level of consciousness: awake and alert Pain management: pain level controlled Vital Signs Assessment: post-procedure vital signs reviewed and stable Respiratory status: spontaneous breathing, nonlabored ventilation and respiratory function stable Cardiovascular status: blood pressure returned to baseline Postop Assessment: no apparent nausea or vomiting Anesthetic complications: no   No notable events documented.  Last Vitals:  Vitals:   12/14/22 0956 12/14/22 1000  BP:  (!) 94/50  Pulse: (!) 59 (!) 59  Resp:    Temp:    SpO2:  99%    Last Pain:  Vitals:   12/14/22 0930  TempSrc:   PainSc: 0-No pain                 Shanda Howells

## 2022-12-14 NOTE — Op Note (Signed)
[facility] Patient Name: Troy Stevenson Procedure Date: 12/14/2022 MRN: [id number] Attending MD: Dub Amis. Tomasa Rand , MD, [id number] Date of Birth: 16-Sep-1965 CSN: [id number] Age: 57 Admit Type: Outpatient Procedure:                Upper GI endoscopy Indications:              Iron deficiency anemia Providers:                Lorin Picket E. Tomasa Rand, MD, Adolph Pollack, RN,                            Rozetta Nunnery, Technician Referring MD:              Medicines:                Monitored Anesthesia Care Complications:            No immediate complications. Estimated Blood Loss:     Estimated blood loss was minimal. Procedure:                Pre-Anesthesia Assessment:                           - Prior to the procedure, a History and Physical                            was performed, and patient medications and                            allergies were reviewed. The patient's tolerance of                            previous anesthesia was also reviewed. The risks                            and benefits of the procedure and the sedation                            options and risks were discussed with the patient.                            All questions were answered, and informed consent                            was obtained. Prior Anticoagulants: The patient has                            taken no anticoagulant or antiplatelet agents. ASA                            Grade Assessment: II - A patient with mild systemic                            disease. After reviewing the risks and benefits,  the patient was deemed in satisfactory condition to                            undergo the procedure.                           After obtaining informed consent, the endoscope was                            passed under direct vision. Throughout the                            procedure, the patient's blood pressure, pulse, and                             oxygen saturations were monitored continuously. The                            [id number]-[id number] ([id number]) Olympus endoscope was introduced                            through the mouth, and advanced to the second part                            of duodenum. The upper GI endoscopy was                            accomplished without difficulty. The patient                            tolerated the procedure well. Scope In: Scope Out: Findings:      The examined esophagus was normal.      Multiple 3 to 12 mm semi-pedunculated polyps were found in the gastric       body. Ten of the largest polyps were removed with a hot snare. Resection       was complete, but not all of the polyps were retrieved. Estimated blood       loss was minimal.      The exam of the stomach was otherwise normal.      The examined duodenum was normal. Using the endoscope, the video capsule       enteroscope was advanced into the second portion of the duodenum and       released. Impression:               - Normal esophagus.                           - Multiple gastric polyps. Complete resection.                            Partial retrieval.                           - Normal examined duodenum.                           -  Successful completion of the Video Capsule                            Enteroscope placement. Moderate Sedation:      Not Applicable - Patient had care per Anesthesia. Recommendation:           - Patient has a contact number available for                            emergencies. The signs and symptoms of potential                            delayed complications were discussed with the                            patient. Return to normal activities tomorrow.                            Written discharge instructions were provided to the                            patient.                           - Follow dietary instructions provided for capsule                            endoscopy study.                            - Await pathology results. Procedure Code(s):        --- Professional ---                           [phone], Esophagogastroduodenoscopy, flexible,                            transoral; with removal of tumor(s), polyp(s), or                            other lesion(s) by snare technique Diagnosis Code(s):        --- Professional ---                           K31.7, Polyp of stomach and duodenum                           D50.9, Iron deficiency anemia, unspecified CPT copyright 2022 American Medical Association. All rights reserved. The codes documented in this report are preliminary and upon coder review may  be revised to meet current compliance requirements. Jeraline Marcinek E. Tomasa Rand, MD 12/14/2022 9:25:05 AM This report has been signed electronically. Number of Addenda: 0

## 2022-12-14 NOTE — H&P (Signed)
[facility] History and Physical   Primary Care Physician:  Georgina Quint, MD   Reason for Procedure:   Iron deficiency anemia  Plan:    EGD with video capsule endoscopy placement     HPI: Troy Stevenson is a 57 y.o. male undergoing EGD with video capsule placement to evaluate severe iron deficiency anemia.  He had a VCE completed 3 weeks ago while admitted to the hospital for anemia, but the study was limited due to poor visualization of the intestines (residual food debris).  He reports seeing black stools recently since his discharge, but this may be related to oral iron.  He states he is unable to swallow large pills which is why the capsule is being placed endoscopically.   Past Medical History:  Diagnosis Date   Anemia Aug 19 2022   Anxiety    Blood transfusion without reported diagnosis Feb14 2024   Hepatitis     Past Surgical History:  Procedure Laterality Date   BIOPSY  08/21/2022   Procedure: BIOPSY;  Surgeon: Jenel Lucks, MD;  Location: [facility] ENDOSCOPY;  Service: Gastroenterology;;   COLONOSCOPY WITH PROPOFOL N/A 08/21/2022   Procedure: COLONOSCOPY WITH PROPOFOL;  Surgeon: Jenel Lucks, MD;  Location: [facility] ENDOSCOPY;  Service: Gastroenterology;  Laterality: N/A;   ESOPHAGOGASTRODUODENOSCOPY N/A 11/27/2022   Procedure: ESOPHAGOGASTRODUODENOSCOPY (EGD);  Surgeon: Napoleon Form, MD;  Location: [facility] ENDOSCOPY;  Service: Gastroenterology;  Laterality: N/A;   ESOPHAGOGASTRODUODENOSCOPY (EGD) WITH PROPOFOL N/A 08/21/2022   Procedure: ESOPHAGOGASTRODUODENOSCOPY (EGD) WITH PROPOFOL;  Surgeon: Jenel Lucks, MD;  Location: [facility] ENDOSCOPY;  Service: Gastroenterology;  Laterality: N/A;   GIVENS CAPSULE STUDY N/A 11/27/2022   Procedure: GIVENS CAPSULE STUDY;  Surgeon: Napoleon Form, MD;  Location: [facility] ENDOSCOPY;  Service: Gastroenterology;  Laterality: N/A;   MALONEY DILATION  11/27/2022   Procedure: Elease Hashimoto DILATION;  Surgeon:  Napoleon Form, MD;  Location: [facility] ENDOSCOPY;  Service: Gastroenterology;;  55   POLYPECTOMY  08/21/2022   Procedure: POLYPECTOMY;  Surgeon: Jenel Lucks, MD;  Location: [facility] ENDOSCOPY;  Service: Gastroenterology;;    Prior to Admission medications   Medication Sig Start Date End Date Taking? Authorizing Provider  amoxicillin-clavulanate (AUGMENTIN) 875-125 MG tablet Take 1 tablet by mouth every 12 (twelve) hours. 12/02/22  Yes Carlisle Beers, FNP  B Complex Vitamins (VITAMIN B COMPLEX) TABS Take 1 tablet by mouth daily.   Yes [provider]  CALCIUM PO Take 1 tablet by mouth daily.   Yes [provider]  ferrous sulfate 325 (65 FE) MG EC tablet Take 1 tablet (325 mg total) by mouth 2 (two) times daily. 11/28/22 02/26/23 Yes [place], Eric J, DO  pantoprazole (PROTONIX) 40 MG tablet Take 1 tablet (40 mg total) by mouth 2 (two) times daily. 11/28/22 01/27/23 Yes [place], Eric J, DO  sucralfate (CARAFATE) 1 g tablet Take 1 tablet (1 g total) by mouth 2 (two) times daily for 7 days. 11/28/22 12/05/22  [place], Eric J, DO    Current Facility-Administered Medications  Medication Dose Route Frequency Provider Last Rate Last Admin   0.9 %  sodium chloride infusion   Intravenous Continuous Jenel Lucks, MD       lactated ringers infusion   Intravenous Continuous Jenel Lucks, MD 10 mL/hr at 12/14/22 0732 New Bag at 12/14/22 0732    Allergies as of 09/24/2022   (No Known Allergies)    Family History  Problem Relation Age of Onset   Hypertension Father  Diabetes Father    Colon cancer Neg Hx    Rectal cancer Neg Hx     Social History   Socioeconomic History   Marital status: Divorced    Spouse name: Not on file   Number of children: 1   Years of education: Not on file   Highest education level: 11th grade  Occupational History   Not on file  Tobacco Use   Smoking status: Never   Smokeless tobacco: Never  Substance and Sexual Activity    Alcohol use: Not Currently    Alcohol/week: 20.0 standard drinks of alcohol    Types: 20 Cans of beer per week    Comment: 4-5 beers daily   Drug use: Never   Sexual activity: Not Currently  Other Topics Concern   Not on file  Social History Narrative   Not on file   Social Determinants of Health   Financial Resource Strain: Low Risk  (12/07/2022)   Overall Financial Resource Strain (CARDIA)    Difficulty of Paying Living Expenses: Not very hard  Food Insecurity: No Food Insecurity (12/07/2022)   Hunger Vital Sign    Worried About Running Out of Food in the Last Year: Never true    Ran Out of Food in the Last Year: Never true  Transportation Needs: No Transportation Needs (12/07/2022)   PRAPARE - Administrator, Civil Service (Medical): No    Lack of Transportation (Non-Medical): No  Physical Activity: Inactive (12/07/2022)   Exercise Vital Sign    Days of Exercise per Week: 1 day    Minutes of Exercise per Session: 0 min  Stress: Stress Concern Present (12/07/2022)   [employer] of Occupational Health - Occupational Stress Questionnaire    Feeling of Stress : To some extent  Social Connections: Moderately Isolated (12/07/2022)   Social Connection and Isolation Panel [NHANES]    Frequency of Communication with Friends and Family: Three times a week    Frequency of Social Gatherings with Friends and Family: Once a week    Attends Religious Services: 1 to 4 times per year    Active Member of [employer] or Organizations: No    Attends Engineer, structural: Not on file    Marital Status: Divorced  Intimate Partner Violence: Not At Risk (11/25/2022)   Humiliation, Afraid, Rape, and Kick questionnaire    Fear of Current or Ex-Partner: No    Emotionally Abused: No    Physically Abused: No    Sexually Abused: No    Review of Systems:  All other review of systems negative except as mentioned in the HPI.  Physical Exam: Vital signs BP 121/68   Pulse 84   Temp 98.1  F (36.7 C) (Temporal)   Resp 16   Ht 5\' 5"  (1.651 m)   Wt 79.4 kg   SpO2 99%   BMI 29.12 kg/m   General:   Alert,  Well-developed, well-nourished, pleasant and cooperative in NAD Airway:  Mallampati 2 Lungs:  Clear throughout to auscultation.   Heart:  Regular rate and rhythm; no murmurs, clicks, rubs,  or gallops. Abdomen:  Soft, nontender and nondistended. Normal bowel sounds.   Neuro/Psych:  Normal mood and affect. A and O x 3   Joe Gee E. Tomasa Rand, MD [facility]

## 2022-12-14 NOTE — Discharge Instructions (Signed)
YOU HAD AN ENDOSCOPIC PROCEDURE TODAY: Refer to the procedure report and other information in the discharge instructions given to you for any specific questions about what was found during the examination. If this information does not answer your questions, please call [place] office at [phone] to clarify.  ° °YOU SHOULD EXPECT: Some feelings of bloating in the abdomen. Passage of more gas than usual. Walking can help get rid of the air that was put into your GI tract during the procedure and reduce the bloating. If you had a lower endoscopy (such as a colonoscopy or flexible sigmoidoscopy) you may notice spotting of blood in your stool or on the toilet paper. Some abdominal soreness may be present for a day or two, also. ° °DIET: Your first meal following the procedure should be a light meal and then it is ok to progress to your normal diet. A half-sandwich or bowl of soup is an example of a good first meal. Heavy or fried foods are harder to digest and may make you feel nauseous or bloated. Drink plenty of fluids but you should avoid alcoholic beverages for 24 hours. If you had a esophageal dilation, please see attached instructions for diet.   ° °ACTIVITY: Your care partner should take you home directly after the procedure. You should plan to take it easy, moving slowly for the rest of the day. You can resume normal activity the day after the procedure however YOU SHOULD NOT DRIVE, use power tools, machinery or perform tasks that involve climbing or major physical exertion for 24 hours (because of the sedation medicines used during the test).  ° °SYMPTOMS TO REPORT IMMEDIATELY: °A gastroenterologist can be reached at any hour. Please call [phone]  for any of the following symptoms:  °Following lower endoscopy (colonoscopy, flexible sigmoidoscopy) °Excessive amounts of blood in the stool  °Significant tenderness, worsening of abdominal pains  °Swelling of the abdomen that is new, acute  °Fever of 100° or  higher  °Following upper endoscopy (EGD, EUS, ERCP, esophageal dilation) °Vomiting of blood or coffee ground material  °New, significant abdominal pain  °New, significant chest pain or pain under the shoulder blades  °Painful or persistently difficult swallowing  °New shortness of breath  °Black, tarry-looking or red, bloody stools ° °FOLLOW UP:  °If any biopsies were taken you will be contacted by phone or by letter within the next 1-3 weeks. Call [phone]  if you have not heard about the biopsies in 3 weeks.  °Please also call with any specific questions about appointments or follow up tests. ° °

## 2022-12-14 NOTE — Transfer of Care (Signed)
Immediate Anesthesia Transfer of Care Note  Patient: MAXIME BECKNER  Procedure(s) Performed: ESOPHAGOGASTRODUODENOSCOPY (EGD) WITH PROPOFOL GIVENS CAPSULE STUDY POLYPECTOMY  Patient Location: Short Stay  Anesthesia Type:MAC  Level of Consciousness: awake, drowsy, and patient cooperative  Airway & Oxygen Therapy: Patient Spontanous Breathing and Patient connected to face mask oxygen  Post-op Assessment: Report given to RN, Post -op Vital signs reviewed and stable, and Patient moving all extremities X 4  Post vital signs: Reviewed and stable  Last Vitals:  Vitals Value Taken Time  BP 99/62 12/14/22 0923  Temp    Pulse 67 12/14/22 0924  Resp 15 12/14/22 0924  SpO2 100 % 12/14/22 0924  Vitals shown include unvalidated device data.  Last Pain:  Vitals:   12/14/22 0724  TempSrc: Temporal  PainSc: 0-No pain         Complications: No notable events documented.

## 2022-12-15 LAB — SURGICAL PATHOLOGY

## 2022-12-17 ENCOUNTER — Encounter (HOSPITAL_COMMUNITY): Payer: Self-pay | Admitting: Gastroenterology

## 2022-12-24 ENCOUNTER — Ambulatory Visit: Payer: 59 | Admitting: Gastroenterology

## 2022-12-24 ENCOUNTER — Other Ambulatory Visit: Payer: 59

## 2023-01-31 ENCOUNTER — Emergency Department (HOSPITAL_COMMUNITY)
Admission: EM | Admit: 2023-01-31 | Discharge: 2023-01-31 | Disposition: A | Discharge status: Home / Self Care | Payer: 59 | Attending: Emergency Medicine | Admitting: Emergency Medicine

## 2023-01-31 ENCOUNTER — Other Ambulatory Visit: Payer: Self-pay

## 2023-01-31 DIAGNOSIS — U071 COVID-19: Secondary | ICD-10-CM | POA: Diagnosis not present

## 2023-01-31 DIAGNOSIS — J069 Acute upper respiratory infection, unspecified: Secondary | ICD-10-CM

## 2023-01-31 DIAGNOSIS — R059 Cough, unspecified: Secondary | ICD-10-CM | POA: Diagnosis not present

## 2023-01-31 DIAGNOSIS — R051 Acute cough: Secondary | ICD-10-CM

## 2023-01-31 LAB — RESP PANEL BY RT-PCR (RSV, FLU A&B, COVID)  RVPGX2
Influenza A by PCR: NEGATIVE
Influenza B by PCR: NEGATIVE
Resp Syncytial Virus by PCR: NEGATIVE
SARS Coronavirus 2 by RT PCR: POSITIVE — AB

## 2023-01-31 MED ORDER — PAXLOVID (300/100) 20 X 150 MG & 10 X 100MG PO TBPK: 3.0000 | ORAL_TABLET | Freq: Two times a day (BID) | ORAL | 0 refills | Status: AC

## 2023-01-31 NOTE — ED Provider Notes (Signed)
EMERGENCY DEPARTMENT AT [facility] Provider Note   CSN: [id number] Arrival date & time: 01/31/23  1306     History  Chief Complaint  Patient presents with   Cough   Generalized Body Aches    Troy Stevenson is a 57 y.o. male.  The history is provided by the patient and medical records. No language interpreter was used.  Cough Cough characteristics:  Non-productive Severity:  Moderate Onset quality:  Gradual Duration:  3 days Timing:  Intermittent Progression:  Waxing and waning Chronicity:  New Relieved by:  Nothing Worsened by:  Nothing Ineffective treatments:  None tried Associated symptoms: myalgias and sinus congestion   Associated symptoms: no chest pain, no chills, no diaphoresis, no fever, no headaches, no rash, no rhinorrhea, no shortness of breath, no sore throat and no wheezing        Home Medications Prior to Admission medications   Medication Sig Start Date End Date Taking? Authorizing Provider  amoxicillin-clavulanate (AUGMENTIN) 875-125 MG tablet Take 1 tablet by mouth every 12 (twelve) hours. 12/02/22   Carlisle Beers, FNP  B Complex Vitamins (VITAMIN B COMPLEX) TABS Take 1 tablet by mouth daily.    [provider]  CALCIUM PO Take 1 tablet by mouth daily.    [provider]  ferrous sulfate 325 (65 FE) MG EC tablet Take 1 tablet (325 mg total) by mouth 2 (two) times daily. 11/28/22 02/26/23  [place], Alvira Philips, DO  pantoprazole (PROTONIX) 40 MG tablet Take 1 tablet (40 mg total) by mouth 2 (two) times daily. 11/28/22 01/27/23  [place], Alvira Philips, DO  sucralfate (CARAFATE) 1 g tablet Take 1 tablet (1 g total) by mouth 2 (two) times daily for 7 days. 11/28/22 12/05/22  [place], Eric J, DO      Allergies    Patient has no known allergies.    Review of Systems   Review of Systems  Constitutional:  Positive for fatigue. Negative for chills, diaphoresis and fever.  HENT:  Positive for congestion. Negative for  rhinorrhea and sore throat.   Respiratory:  Positive for cough. Negative for chest tightness, shortness of breath and wheezing.   Cardiovascular:  Negative for chest pain.  Gastrointestinal:  Negative for abdominal pain, constipation, diarrhea, nausea and vomiting.  Genitourinary:  Negative for dysuria and flank pain.  Musculoskeletal:  Positive for back pain and myalgias.  Skin:  Negative for rash and wound.  Neurological:  Negative for headaches.  Psychiatric/Behavioral:  Negative for agitation.   All other systems reviewed and are negative.   Physical Exam Updated Vital Signs BP (!) 136/93 (BP Location: Right Arm)   Pulse (!) 102   Temp 98.8 F (37.1 C) (Oral)   Resp 18   Ht 5\' 5"  (1.651 m)   Wt 79.5 kg   SpO2 100%   BMI 29.17 kg/m  Physical Exam Vitals and nursing note reviewed.  Constitutional:      General: He is not in acute distress.    Appearance: He is well-developed. He is not ill-appearing, toxic-appearing or diaphoretic.  HENT:     Head: Normocephalic and atraumatic.     Nose: No rhinorrhea.     Mouth/Throat:     Mouth: Mucous membranes are moist.     Pharynx: No oropharyngeal exudate or posterior oropharyngeal erythema.  Eyes:     Extraocular Movements: Extraocular movements intact.     Conjunctiva/sclera: Conjunctivae normal.     Pupils: Pupils are equal, round, and reactive to  light.  Cardiovascular:     Rate and Rhythm: Normal rate and regular rhythm.     Heart sounds: No murmur heard. Pulmonary:     Effort: Pulmonary effort is normal. No respiratory distress.     Breath sounds: Normal breath sounds. No wheezing, rhonchi or rales.  Chest:     Chest wall: No tenderness.  Abdominal:     Palpations: Abdomen is soft.     Tenderness: There is no abdominal tenderness. There is no guarding or rebound.  Musculoskeletal:        General: No swelling or tenderness.     Cervical back: Neck supple. No tenderness.     Right lower leg: No edema.     Left lower  leg: No edema.  Skin:    General: Skin is warm and dry.     Capillary Refill: Capillary refill takes less than 2 seconds.     Findings: No erythema.  Neurological:     Mental Status: He is alert.  Psychiatric:        Mood and Affect: Mood normal.     ED Results / Procedures / Treatments   Labs (all labs ordered are listed, but only abnormal results are displayed) Labs Reviewed  RESP PANEL BY RT-PCR (RSV, FLU A&B, COVID)  [id number] - Abnormal; Notable for the following components:      Result Value   SARS Coronavirus 2 by RT PCR POSITIVE (*)    All other components within normal limits    EKG None  Radiology No results found.  Procedures Procedures    Medications Ordered in ED Medications - No data to display  ED Course/ Medical Decision Making/ A&P                             Medical Decision Making  Troy Stevenson is a 57 y.o. male with past medical history significant for previous GI bleed, previous anemia, and anxiety who presents with URI symptoms.  According to patient, for the last 3 days he has had cough, congestion, myalgias, and fatigue.  He says that multiple members had similar symptoms.  He reports no chest pain or shortness of breath.  Denies any production with his cough.  Denies any focal headaches but has diffuse myalgias and muscle soreness.  He denies any other focal complaints including no leg pain or leg swelling, no nausea or vomiting, no constipation, diarrhea, or urinary changes.  Denies other complaints on arrival.  On exam, lungs clear.  Chest nontender.  Abdomen nontender.  No murmur.  Good pulses in extremities.  Patient well-appearing.  Mild congestion seen.  No other focal abnormalities on exam.  Due to patient's symptoms and similar symptoms in the home, I do suspect he may have a viral infection like COVID-19 given the ongoing pandemic.  Patient was tested for COVID while in triage and returned positive.  COVID is likely because of all  symptoms.  Given his reassuring lung exam and reassuring vital signs, low suspicion for pneumonia or other acute abnormality.  We discussed that given his positive COVID test and his reassuring exam we feel he is safe for discharge home without more extensive lab testing or imaging at this time.  Patient does want a prescription for Paxlovid which she will fill.  He will treat fevers and chills at home with medications and will rest and stay hydrated.  He understood return precautions and follow-up instructions and was  discharged in good condition.             Final Clinical Impression(s) / ED Diagnoses Final diagnoses:  COVID-19  Upper respiratory tract infection, unspecified type  Acute cough    Rx / DC Orders ED Discharge Orders          Ordered    nirmatrelvir & ritonavir (PAXLOVID, 300/100,) 20 x 150 MG & 10 x 100MG  TBPK  2 times daily        01/31/23 1543            Clinical Impression: 1. COVID-19   2. Upper respiratory tract infection, unspecified type   3. Acute cough     Disposition: Discharge  Condition: Good  I have discussed the results, Dx and Tx plan with the pt(& family if present). He/she/they expressed understanding and agree(s) with the plan. Discharge instructions discussed at great length. Strict return precautions discussed and pt &/or family have verbalized understanding of the instructions. No further questions at time of discharge.    New Prescriptions   NIRMATRELVIR & RITONAVIR (PAXLOVID, 300/100,) 20 X 150 MG & 10 X 100MG  TBPK    Take 3 tablets by mouth 2 (two) times daily for 5 days.    Follow Up: Georgina Quint, MD [address] [place] [address] [phone]     [facility] Emergency Department at [facility] [address] [place] [address] [phone]       Anavi Branscum, Canary Brim, MD 01/31/23 [phone]

## 2023-01-31 NOTE — ED Triage Notes (Signed)
Pt arrived via POV. C/o cough and body aches for 3x days.

## 2023-01-31 NOTE — Discharge Instructions (Signed)
Your history, exam, workup today revealed you do have COVID-19 is the likely cause of your symptoms of upper respiratory infection and cough.  Your exam was reassuring and we agreed to hold on more extensive lab testing or x-rays at this time but we have low suspicion for pneumonia currently.  Please rest and stay hydrated and consider taking the Paxlovid.  Please treat any fevers with Motrin and Tylenol.  If any symptoms change or worsen acutely, please return to the nearest emergency department.

## 2023-05-04 DIAGNOSIS — Z20822 Contact with and (suspected) exposure to covid-19: Secondary | ICD-10-CM | POA: Diagnosis not present

## 2023-05-04 DIAGNOSIS — A084 Viral intestinal infection, unspecified: Secondary | ICD-10-CM | POA: Diagnosis not present

## 2023-05-04 DIAGNOSIS — E860 Dehydration: Secondary | ICD-10-CM | POA: Diagnosis not present

## 2023-11-23 DIAGNOSIS — M2550 Pain in unspecified joint: Secondary | ICD-10-CM | POA: Diagnosis not present

## 2023-11-23 DIAGNOSIS — R5383 Other fatigue: Secondary | ICD-10-CM | POA: Diagnosis not present

## 2023-11-23 DIAGNOSIS — Z8719 Personal history of other diseases of the digestive system: Secondary | ICD-10-CM | POA: Diagnosis not present

## 2023-11-25 ENCOUNTER — Telehealth: Payer: Self-pay | Admitting: Gastroenterology

## 2023-11-25 NOTE — Telephone Encounter (Signed)
 Patient states that he went to a walk in clinic recently for joint pain at which time they did lab work on him. He says he was told that his labs showed a hemoglobin of 11 and iron saturation of 3. Says he was told GI would schedule him for an iron infusion. Patient is advised that unfortunately, I am unable to locate any lab work or office notes from his recent visit. We would not be able to directly schedule him for iron infusions. His last visit with our office was nearly 1 year ago (endo 12/14/22). Advised that we can schedule him for office visit at which time we can make recommendations based on our physician's assessment. My first availability to see patient would be 12/14/23. Patient states that he does not want to wait and may instead go to the emergency room. I cautioned patient that the emergency room typically does not order iron infusion as low iron saturation itself is not a life threatening condition. I explained to the patient that the clinic that ordered lab work and evaluated him should be making the appropriate referrals for iron infusion if appropriate. I asked that he reach out to the ordering clinic to inquire. Patient verbalizes understanding.  Of note: patient denies and GI bleeding, melena, hematochezia, hematemesis or GI pain. States he was only having joint pains.  He does have HX of IDA at which time he had endo/colon 2024 showing multiple gastric polyps, internal hemorrhoids. Had capsule endoscopy that was normal.

## 2023-11-25 NOTE — Telephone Encounter (Signed)
 PT was advised that he needs to have an appointment for infusions but he was told it could just be scheduled by us . Requesting to speak with a nurse regarding this information. Please advise.

## 2023-12-01 ENCOUNTER — Encounter: Payer: Self-pay | Admitting: Emergency Medicine

## 2023-12-01 ENCOUNTER — Ambulatory Visit: Payer: Self-pay | Admitting: Emergency Medicine

## 2023-12-01 ENCOUNTER — Ambulatory Visit (INDEPENDENT_AMBULATORY_CARE_PROVIDER_SITE_OTHER): Admitting: Emergency Medicine

## 2023-12-01 VITALS — BP 122/70 | HR 88 | Temp 98.0°F | Ht 65.0 in | Wt 178.0 lb

## 2023-12-01 DIAGNOSIS — D509 Iron deficiency anemia, unspecified: Secondary | ICD-10-CM

## 2023-12-01 LAB — COMPREHENSIVE METABOLIC PANEL WITH GFR
ALT: 30 U/L (ref 0–53)
AST: 28 U/L (ref 0–37)
Albumin: 4.5 g/dL (ref 3.5–5.2)
Alkaline Phosphatase: 103 U/L (ref 39–117)
BUN: 20 mg/dL (ref 6–23)
CO2: 27 meq/L (ref 19–32)
Calcium: 9.5 mg/dL (ref 8.4–10.5)
Chloride: 102 meq/L (ref 96–112)
Creatinine, Ser: 0.77 mg/dL (ref 0.40–1.50)
GFR: 100.1 mL/min (ref >60.00)
Glucose, Bld: 98 mg/dL (ref 70–99)
Potassium: 4.1 meq/L (ref 3.5–5.1)
Sodium: 136 meq/L (ref 135–145)
Total Bilirubin: 0.6 mg/dL (ref 0.2–1.2)
Total Protein: 7.9 g/dL (ref 6.0–8.3)

## 2023-12-01 LAB — CBC
HCT: 35.6 % — ABNORMAL LOW (ref 39.0–52.0)
Hemoglobin: 10.9 g/dL — ABNORMAL LOW (ref 13.0–17.0)
MCHC: 30.5 g/dL (ref 30.0–36.0)
MCV: 61.7 fl — ABNORMAL LOW (ref 78.0–100.0)
Platelets: 320 10*3/uL (ref 150.0–400.0)
RBC: 5.76 Mil/uL (ref 4.22–5.81)
RDW: 18.3 % — ABNORMAL HIGH (ref 11.5–15.5)
WBC: 5.5 10*3/uL (ref 4.0–10.5)

## 2023-12-01 LAB — FOLATE: Folate: 15.8 ng/mL (ref >5.9)

## 2023-12-01 LAB — FERRITIN: Ferritin: 5.4 ng/mL — ABNORMAL LOW (ref 22.0–322.0)

## 2023-12-01 LAB — VITAMIN B12: Vitamin B-12: 933 pg/mL — ABNORMAL HIGH (ref 211–911)

## 2023-12-01 NOTE — Progress Notes (Signed)
 Troy Stevenson 58 y.o.   Chief Complaint  Patient presents with   Referral    Patient states he went to a walk-in clinic last week for joint pain. He did blood work with them and that's where he saw his iron level was low. Patient also mentions on last Wednesday he hurt his right waist, by moving wrong states he is feeling a little better     HISTORY OF PRESENT ILLNESS: This is a 58 y.o. male with history of chronic iron deficiency anemia. Recently went to urgent care center complaining of joint pains and blood work showed severe iron deficiency.  Recommended to have iron infusion. Told he needs referral from his PCP. Denies shortness of breath, chest pain, syncope, excessive weakness or lightheadedness/dizziness.   HPI   Prior to Admission medications   Medication Sig Start Date End Date Taking? Authorizing Provider  B Complex Vitamins (VITAMIN B COMPLEX) TABS Take 1 tablet by mouth daily.   Yes [provider]  omeprazole  (PRILOSEC) 40 MG capsule Take 40 mg by mouth daily. 02/25/18  Yes [provider]  amoxicillin -clavulanate (AUGMENTIN ) 875-125 MG tablet Take 1 tablet by mouth every 12 (twelve) hours. Patient not taking: Reported on 12/01/2023 12/02/22   Starlene Eaton, FNP  CALCIUM PO Take 1 tablet by mouth daily. Patient not taking: Reported on 12/01/2023    [provider]  ferrous sulfate  325 (65 FE) MG EC tablet Take 1 tablet (325 mg total) by mouth 2 (two) times daily. 11/28/22 02/26/23  [place], Eric J, DO  pantoprazole  (PROTONIX ) 40 MG tablet Take 1 tablet (40 mg total) by mouth 2 (two) times daily. 11/28/22 01/27/23  [place], Rema Care, DO  sucralfate  (CARAFATE ) 1 g tablet Take 1 tablet (1 g total) by mouth 2 (two) times daily for 7 days. 11/28/22 12/05/22  [place], Eric J, DO    No Known Allergies  Patient Active Problem List   Diagnosis Date Noted   Chronic blood loss anemia 12/07/2022   Diverticulosis 12/07/2022   Internal hemorrhoids  12/07/2022   Chronic GI bleeding 11/25/2022   Iron deficiency anemia 08/21/2022   Fundic gland polyposis of stomach 08/21/2022   Symptomatic anemia 08/19/2022   Chronic anxiety 02/24/2013    Past Medical History:  Diagnosis Date   Anemia Aug 19 2022   Anxiety    Blood transfusion without reported diagnosis Feb14 2024   Hepatitis     Past Surgical History:  Procedure Laterality Date   BIOPSY  08/21/2022   Procedure: BIOPSY;  Surgeon: Elois Hair, MD;  Location: [facility] ENDOSCOPY;  Service: Gastroenterology;;   COLONOSCOPY WITH PROPOFOL  N/A 08/21/2022   Procedure: COLONOSCOPY WITH PROPOFOL ;  Surgeon: Elois Hair, MD;  Location: [facility] ENDOSCOPY;  Service: Gastroenterology;  Laterality: N/A;   ESOPHAGOGASTRODUODENOSCOPY N/A 11/27/2022   Procedure: ESOPHAGOGASTRODUODENOSCOPY (EGD);  Surgeon: Nandigam, Kavitha V, MD;  Location: [facility] ENDOSCOPY;  Service: Gastroenterology;  Laterality: N/A;   ESOPHAGOGASTRODUODENOSCOPY (EGD) WITH PROPOFOL  N/A 08/21/2022   Procedure: ESOPHAGOGASTRODUODENOSCOPY (EGD) WITH PROPOFOL ;  Surgeon: Elois Hair, MD;  Location: [facility] ENDOSCOPY;  Service: Gastroenterology;  Laterality: N/A;   ESOPHAGOGASTRODUODENOSCOPY (EGD) WITH PROPOFOL  N/A 12/14/2022   Procedure: ESOPHAGOGASTRODUODENOSCOPY (EGD) WITH PROPOFOL ;  Surgeon: Elois Hair, MD;  Location: [facility] ENDOSCOPY;  Service: Gastroenterology;  Laterality: N/A;   GIVENS CAPSULE STUDY N/A 11/27/2022   Procedure: GIVENS CAPSULE STUDY;  Surgeon: Sergio Dandy, MD;  Location: [facility] ENDOSCOPY;  Service: Gastroenterology;  Laterality: N/A;   GIVENS CAPSULE STUDY N/A 12/14/2022  Procedure: GIVENS CAPSULE STUDY;  Surgeon: Elois Hair, MD;  Location: [facility] ENDOSCOPY;  Service: Gastroenterology;  Laterality: N/A;   MALONEY DILATION  11/27/2022   Procedure: MALONEY DILATION;  Surgeon: Sergio Dandy, MD;  Location: [facility] ENDOSCOPY;  Service: Gastroenterology;;  55   POLYPECTOMY  08/21/2022   Procedure:  POLYPECTOMY;  Surgeon: Elois Hair, MD;  Location: [facility] ENDOSCOPY;  Service: Gastroenterology;;   POLYPECTOMY  12/14/2022   Procedure: POLYPECTOMY;  Surgeon: Elois Hair, MD;  Location: [facility] ENDOSCOPY;  Service: Gastroenterology;;    Social History   Socioeconomic History   Marital status: Divorced    Spouse name: Not on file   Number of children: 1   Years of education: Not on file   Highest education level: 11th grade  Occupational History   Not on file  Tobacco Use   Smoking status: Never   Smokeless tobacco: Never  Substance and Sexual Activity   Alcohol use: Not Currently    Alcohol/week: 20.0 standard drinks of alcohol    Types: 20 Cans of beer per week    Comment: 4-5 beers daily   Drug use: Never   Sexual activity: Not Currently  Other Topics Concern   Not on file  Social History Narrative   Not on file   Social Drivers of Health   Financial Resource Strain: Low Risk  (12/07/2022)   Overall Financial Resource Strain (CARDIA)    Difficulty of Paying Living Expenses: Not very hard  Food Insecurity: No Food Insecurity (12/07/2022)   Hunger Vital Sign    Worried About Running Out of Food in the Last Year: Never true    Ran Out of Food in the Last Year: Never true  Transportation Needs: No Transportation Needs (12/07/2022)   PRAPARE - Transportation    Lack of Transportation (Medical): No    Lack of Transportation (Non-Medical): No  Physical Activity: Inactive (12/07/2022)   Exercise Vital Sign    Days of Exercise per Week: 1 day    Minutes of Exercise per Session: 0 min  Stress: Stress Concern Present (12/07/2022)   [employer] of Occupational Health - Occupational Stress Questionnaire    Feeling of Stress : To some extent  Social Connections: Moderately Isolated (12/07/2022)   Social Connection and Isolation Panel [NHANES]    Frequency of Communication with Friends and Family: Three times a week    Frequency of Social Gatherings with Friends and Family:  Once a week    Attends Religious Services: 1 to 4 times per year    Active Member of [employer] or Organizations: No    Attends Engineer, structural: Not on file    Marital Status: Divorced  Intimate Partner Violence: Not At Risk (11/25/2022)   Humiliation, Afraid, Rape, and Kick questionnaire    Fear of Current or Ex-Partner: No    Emotionally Abused: No    Physically Abused: No    Sexually Abused: No    Family History  Problem Relation Age of Onset   Hypertension Father    Diabetes Father    Colon cancer Neg Hx    Rectal cancer Neg Hx      Review of Systems  Constitutional: Negative.  Negative for chills and fever.  HENT: Negative.  Negative for congestion and sore throat.   Respiratory: Negative.  Negative for cough and shortness of breath.   Cardiovascular: Negative.  Negative for chest pain and palpitations.  Gastrointestinal:  Negative for abdominal pain, diarrhea, nausea and vomiting.  Genitourinary: Negative.  Negative for dysuria and hematuria.  Musculoskeletal:  Positive for joint pain.  Skin: Negative.  Negative for rash.  Neurological: Negative.  Negative for dizziness and headaches.  All other systems reviewed and are negative.   Vitals:   12/01/23 0830  BP: 122/70  Pulse: 88  Temp: 98 F (36.7 C)  SpO2: 97%    Physical Exam Vitals reviewed.  Constitutional:      Appearance: Normal appearance.  HENT:     Head: Normocephalic.     Mouth/Throat:     Mouth: Mucous membranes are moist.     Pharynx: Oropharynx is clear.  Eyes:     Extraocular Movements: Extraocular movements intact.     Conjunctiva/sclera: Conjunctivae normal.     Pupils: Pupils are equal, round, and reactive to light.  Cardiovascular:     Rate and Rhythm: Normal rate and regular rhythm.     Pulses: Normal pulses.     Heart sounds: Normal heart sounds.  Pulmonary:     Effort: Pulmonary effort is normal.     Breath sounds: Normal breath sounds.  Musculoskeletal:     Cervical  back: No tenderness.  Lymphadenopathy:     Cervical: No cervical adenopathy.  Skin:    General: Skin is warm and dry.     Capillary Refill: Capillary refill takes less than 2 seconds.  Neurological:     General: No focal deficit present.     Mental Status: He is alert and oriented to person, place, and time.  Psychiatric:        Mood and Affect: Mood normal.        Behavior: Behavior normal.      ASSESSMENT & PLAN: A total of 42 minutes was spent with the patient and counseling/coordination of care regarding preparing for this visit, review of most recent office visit notes, diagnosis of chronic iron deficiency anemia and need for hematology evaluation and possible iron transfusions, review of most recent upper and lower endoscopy reports, review of all medications, review of most recent bloodwork results, review of health maintenance items, education on nutrition, prognosis, documentation, and need for follow up.   Problem List Items Addressed This Visit       Other   Chronic iron deficiency anemia - Primary   Clinically stable and presently asymptomatic States recent blood results showed a severe iron deficiency and was recommended iron infusion therapy. Recommend hematology evaluation.  Referral placed today. Recommend to repeat blood work today. ED precautions given      Relevant Orders   Comprehensive metabolic panel with GFR   Vitamin B12   Folate   Iron and TIBC   Ferritin   CBC   Ambulatory referral to Hematology / Oncology   Patient Instructions  Anemia por deficiencia de hierro en los adultos Iron Deficiency Anemia, Adult  La anemia por deficiencia de hierro ocurre cuando no hay suficientes glbulos rojos o hemoglobina en la sangre. Esto ocurre por la cantidad deficiente de Development worker, community. La hemoglobina transporta oxgeno a todo el cuerpo. La anemia puede hacer que el cuerpo no obtenga suficiente oxgeno. Cules son las causas? Consumo insuficiente  de alimentos que contienen hierro. Incapacidad del organismo de Engineer, petroleum. Prdida de sangre. Qu incrementa el riesgo? Tener perodos menstruales. Estar embarazada. Cules son los signos o sntomas? Piel, labios y uas plidos. Debilidad, mareos y cansarse con facilidad. Sensacin de que no puede respirar bien cuando se mueve (falta de aire). Manos y pies  fros. Los casos leves de anemia podran no causar sntomas. Cmo se trata? El tratamiento de esta afeccin es descubrir por qu no tiene suficiente hierro y Engineer, mining suministrarle ms hierro. Puede incluir lo siguiente: Agregar alimentos con alto contenido de hierro a Lobbyist. Tomar comprimidos de hierro (suplementos). Si est embarazada o amamantando, es posible que necesite tomar hierro adicional. La dieta generalmente no le proporciona la cantidad de hierro que necesita. Incluir ms vitamina C en su dieta. La vitamina C ayuda al organismo a Land. Es posible que deba tomar comprimidos de hierro con un vaso de jugo de naranja o comprimidos de vitamina C. Medicamentos para disminuir los perodos menstruales abundantes. Ciruga o procedimientos de prueba para determinar la causa de la afeccin. Es posible que deba hacerse anlisis de sangre para ver si el tratamiento est funcionando. Si el tratamiento no funciona, es posible que deba realizarse [address] Siga estas instrucciones en su casa: Medicamentos Use los medicamentos de venta libre y los recetados solamente como se lo haya indicado el mdico. Esto incluye los comprimidos de hierro y las vitaminas. Es importante tomarlos segn las indicaciones, ya que un exceso de hierro puede ser perjudicial. [employer] comprimidos de hierro con el estmago vaco. Si no los [place], tmelos con la comida. No beba leche ni tome anticidos en el mismo momento que los comprimidos de hierro. Los comprimidos de hierro Banker materia fecal (heces)se vuelvan  negras. Si no tolera tomar comprimidos de hierro, pregntele al [employer] sobre estas otras maneras de incorporar hierro: Un tubo (catter) intravenoso. Mediante una inyeccin (inyectable) en el msculo. Comida y bebida Hable con su mdico antes de cambiar los alimentos que consume. El mdico puede indicarle que coma alimentos que tengan gran cantidad de hierro, como, por ejemplo: Hgado. Carne de res con bajo contenido de grasa Annalee Kiang). Panes y cereales con hierro agregado. Huevos. Frutas pasas. Verduras de hojas color verde oscuro. Consuma frutas y verduras frescas con alto contenido de vitamina C, ya que ayudan a que el organismo incorpore el hierro. Algunos alimentos que contienen gran cantidad de vitamina C: Naranjas. Pimientos. Tomates. Mangos. Control del estreimiento Si toma comprimidos de hierro, estos pueden causar problemas para defecar (estreimiento). Para prevenir o tratar esto, es posible que deba hacer lo siguiente: Beber suficiente lquido para Radio producer pis (orina) de color amarillo plido. Usar medicamentos recetados o de Sales promotion account executive. [place] alimentos ricos en fibra. Entre ellos, frijoles, cereales integrales y frutas y verduras frescas. Limitar los alimentos con alto contenido de grasa y International aid/development worker. Estos incluyen alimentos fritos o dulces. Instrucciones generales Regrese a sus actividades normales cuando el mdico le diga que es [place]. Concurra a todas las visitas de seguimiento. Comunquese con un mdico si: Tiene ganas de vomitar (nuseas) o vomita. Se siente dbil. Se siente mareado al levantarse despus de haber estado sentado o acostado. Viann Graces sin motivo. Tiene dificultad para defecar. La respiracin empeora cuando hace actividad fsica. Siente pesadez en el pecho. Solicite ayuda de inmediato si: Se desmaya. Si esto sucede, no conduzca por sus propios [employer]. Tiene latidos cardacos acelerados o latidos cardacos que no se sienten  regulares. Resumen La anemia por deficiencia de hierro se produce cuando hay muy poco hierro en el cuerpo. El tratamiento de esta afeccin es descubrir por qu no tiene suficiente hierro en el cuerpo y luego suministrarle ms hierro. Use los medicamentos de venta libre y los recetados solamente como se lo haya indicado el mdico.  Consuma frutas y verduras frescas con alto contenido de vitamina C. Comunquese con un mdico si tiene dificultad para defecar o se siente dbil. Esta informacin no tiene Theme park manager el consejo del mdico. Asegrese de hacerle al mdico cualquier pregunta que tenga. Document Revised: 08/26/2021 Document Reviewed: 08/26/2021 Elsevier Patient Education  2024 Elsevier Inc.    Maryagnes Small, MD [facility]

## 2023-12-01 NOTE — Patient Instructions (Signed)
 Anemia por deficiencia de hierro en los adultos Iron Deficiency Anemia, Adult  La anemia por deficiencia de hierro ocurre cuando no hay suficientes glbulos rojos o hemoglobina en la sangre. Esto ocurre por la cantidad deficiente de Development worker, community. La hemoglobina transporta oxgeno a todo el cuerpo. La anemia puede hacer que el cuerpo no obtenga suficiente oxgeno. Cules son las causas? Consumo insuficiente de alimentos que contienen hierro. Incapacidad del organismo de Engineer, petroleum. Prdida de sangre. Qu incrementa el riesgo? Tener perodos menstruales. Estar embarazada. Cules son los signos o sntomas? Piel, labios y uas plidos. Debilidad, mareos y cansarse con facilidad. Sensacin de que no puede respirar bien cuando se mueve (falta de aire). Manos y pies fros. Los casos leves de anemia podran no causar sntomas. Cmo se trata? El tratamiento de esta afeccin es descubrir por qu no tiene suficiente hierro y Engineer, mining suministrarle ms hierro. Puede incluir lo siguiente: Agregar alimentos con alto contenido de hierro a Lobbyist. Tomar comprimidos de hierro (suplementos). Si est embarazada o amamantando, es posible que necesite tomar hierro adicional. La dieta generalmente no le proporciona la cantidad de hierro que necesita. Incluir ms vitamina C en su dieta. La vitamina C ayuda al organismo a Land. Es posible que deba tomar comprimidos de hierro con un vaso de jugo de naranja o comprimidos de vitamina C. Medicamentos para disminuir los perodos menstruales abundantes. Ciruga o procedimientos de prueba para determinar la causa de la afeccin. Es posible que deba hacerse anlisis de sangre para ver si el tratamiento est funcionando. Si el tratamiento no funciona, es posible que deba realizarse [address] Siga estas instrucciones en su casa: Medicamentos Use los medicamentos de venta libre y los recetados solamente como se lo haya indicado el  mdico. Esto incluye los comprimidos de hierro y las vitaminas. Es importante tomarlos segn las indicaciones, ya que un exceso de hierro puede ser perjudicial. [employer] comprimidos de hierro con el estmago vaco. Si no los [place], tmelos con la comida. No beba leche ni tome anticidos en el mismo momento que los comprimidos de hierro. Los comprimidos de hierro Banker materia fecal (heces)se vuelvan negras. Si no tolera tomar comprimidos de hierro, pregntele al [employer] sobre estas otras maneras de incorporar hierro: Un tubo (catter) intravenoso. Mediante una inyeccin (inyectable) en el msculo. Comida y bebida Hable con su mdico antes de cambiar los alimentos que consume. El mdico puede indicarle que coma alimentos que tengan gran cantidad de hierro, como, por ejemplo: Hgado. Carne de res con bajo contenido de grasa Annalee Kiang). Panes y cereales con hierro agregado. Huevos. Frutas pasas. Verduras de hojas color verde oscuro. Consuma frutas y verduras frescas con alto contenido de vitamina C, ya que ayudan a que el organismo incorpore el hierro. Algunos alimentos que contienen gran cantidad de vitamina C: Naranjas. Pimientos. Tomates. Mangos. Control del estreimiento Si toma comprimidos de hierro, estos pueden causar problemas para defecar (estreimiento). Para prevenir o tratar esto, es posible que deba hacer lo siguiente: Beber suficiente lquido para Radio producer pis (orina) de color amarillo plido. Usar medicamentos recetados o de Sales promotion account executive. [place] alimentos ricos en fibra. Entre ellos, frijoles, cereales integrales y frutas y verduras frescas. Limitar los alimentos con alto contenido de grasa y International aid/development worker. Estos incluyen alimentos fritos o dulces. Instrucciones generales Regrese a sus actividades normales cuando el mdico le diga que es [place]. Concurra a todas las visitas de seguimiento. Comunquese con un mdico si: Tiene ganas de  vomitar (nuseas) o vomita. Se  siente dbil. Se siente mareado al levantarse despus de haber estado sentado o acostado. Viann Graces sin motivo. Tiene dificultad para defecar. La respiracin empeora cuando hace actividad fsica. Siente pesadez en el pecho. Solicite ayuda de inmediato si: Se desmaya. Si esto sucede, no conduzca por sus propios [employer]. Tiene latidos cardacos acelerados o latidos cardacos que no se sienten regulares. Resumen La anemia por deficiencia de hierro se produce cuando hay muy poco hierro en el cuerpo. El tratamiento de esta afeccin es descubrir por qu no tiene suficiente hierro en el cuerpo y luego suministrarle ms hierro. Use los medicamentos de venta libre y los recetados solamente como se lo haya indicado el mdico. Consuma frutas y verduras frescas con alto contenido de vitamina C. Comunquese con un mdico si tiene dificultad para defecar o se siente dbil. Esta informacin no tiene Theme park manager el consejo del mdico. Asegrese de hacerle al mdico cualquier pregunta que tenga. Document Revised: 08/26/2021 Document Reviewed: 08/26/2021 Elsevier Patient Education  2024 [employer].

## 2023-12-01 NOTE — Assessment & Plan Note (Signed)
 Clinically stable and presently asymptomatic States recent blood results showed a severe iron deficiency and was recommended iron infusion therapy. Recommend hematology evaluation.  Referral placed today. Recommend to repeat blood work today. ED precautions given

## 2023-12-02 LAB — IRON, TOTAL/TOTAL IRON BINDING CAP
%SAT: 4 % — ABNORMAL LOW (ref 20–48)
Iron: 19 ug/dL — ABNORMAL LOW (ref 50–180)
TIBC: 530 ug/dL — ABNORMAL HIGH (ref 250–425)

## 2023-12-20 NOTE — Progress Notes (Signed)
 [facility]   [phone] [phone]   Clinic New consult Note   Patient Care Team: Purcell Emil Schanz, MD as PCP - General (Internal Medicine) 12/21/2023  CHIEF COMPLAINTS/PURPOSE OF CONSULTATION:  Iron deficiency anemia, referred by PCP Dr. Purcell  HISTORY OF PRESENTING ILLNESS:  Troy Stevenson 59 y.o. male with PMH including anxiety, hemorrhoids, diverticulosis, fundic gland polyposis of the stomach, and anemia is here because of iron deficiency anemia. CBC 03/01/2017 was normal, by 08/20/2018 he had developed hematochezia and a microcytic anemia with Hgb 9.7 and MCV 75.5. Colonoscopy by Dr. Teressa 08/26/18 showed a friable nodule in the proximal anus which was the suspected bleeding source. Path showed polypoid mildly inflammed granulation tissue, negative for malignancy. There is a gap in records until he developed symptomatic anemia and came to ED 08/19/22, Hgb dow to 5.8, iron 17, TIBC 535, 3% saturation, ferritin 1. FOBT negative. He was transfused 2 units and received 1 dose ferrlecit 250 mg in the hospital. EGD/colonoscopy by Dr. Stacia 08/21/22 showed multiple non-bleeding gastric polyps, non-bleeding internal hemorroids, and a few small- mouthed diverticula. CT entero was negative. The bleeding source was not evident. Ferritin 3% on 09/24/22. Unfortunately he had persistent hematochezia and presented back to ED 11/25/22, Hgb 8.0 MCV 68.4 FOBT negative with iron deficiency serum iron 14, TIBC 487, Ferritin 2.1. He received Ferrlecit 250 mg x3. EGD negative for bleeding. Capsule endoscopy showed erosion in the proximal small bowel with fresh heme otherwise unremakrable exam. He was started on PPI. Hgb improved to 9. Repeat EGD 12/14/22 showed multiple gastric polyps and capsule endoscopy showed no AVMs, mass/lesions, or bleeding. It was felt possible that he could have bleeding from large gastric polyps and/or from hemorrhoids. He began taking Luna  Sundara Dragon's Blood supplement from [place]. The bleeding stopped after 2 days and typically stays away as long as he doesn't eat spicy foods or consume heavy alcohol. Recently he developed joint pain and had work up, labs showed Hgb up to 10.9 on 12/01/23 with iron 19, TIBC 530, 4% and ferritin 5.4. He began taking oral iron which he tolerates well and eating red meat. Joint pain has improved some but not resolved. He was referred to hematology for ongoing IV iron infusions. Denies h/o GI/bariatric surgery, autoimmune condition, pica, anticoagulation, or B12/folate deficiencies.   Socially, originally from [place].  Works as a Nutritional therapist, independent with ADLs and drives.  Denies liquor but drinks 2-3 beers per day, denies tobacco use.  No family history of anemia, MGM had stomach cancer.    Today he presents by himself, feeling well in general with mild joint pain.  He has mild intermittent rectal bleeding but not nearly as much as before. Denies other bleeding such as epistaxis or hematuria. Denies symptoms of anemia such as fatigue, shortness of breath, palpitations, or presyncope.     MEDICAL HISTORY:  Past Medical History:  Diagnosis Date   Anemia Aug 19 2022   Anxiety    Blood transfusion without reported diagnosis Feb14 2024   Hepatitis     SURGICAL HISTORY: Past Surgical History:  Procedure Laterality Date   BIOPSY  08/21/2022   Procedure: BIOPSY;  Surgeon: Stacia Glendia BRAVO, MD;  Location: [facility] ENDOSCOPY;  Service: Gastroenterology;;   COLONOSCOPY WITH PROPOFOL  N/A 08/21/2022   Procedure: COLONOSCOPY WITH PROPOFOL ;  Surgeon: Stacia Glendia BRAVO, MD;  Location: [facility] ENDOSCOPY;  Service: Gastroenterology;  Laterality: N/A;   ESOPHAGOGASTRODUODENOSCOPY N/A 11/27/2022   Procedure: ESOPHAGOGASTRODUODENOSCOPY (EGD);  Surgeon:  Nandigam, Kavitha V, MD;  Location: [facility] ENDOSCOPY;  Service: Gastroenterology;  Laterality: N/A;   ESOPHAGOGASTRODUODENOSCOPY (EGD) WITH PROPOFOL  N/A 08/21/2022    Procedure: ESOPHAGOGASTRODUODENOSCOPY (EGD) WITH PROPOFOL ;  Surgeon: Stacia Glendia BRAVO, MD;  Location: [facility] ENDOSCOPY;  Service: Gastroenterology;  Laterality: N/A;   ESOPHAGOGASTRODUODENOSCOPY (EGD) WITH PROPOFOL  N/A 12/14/2022   Procedure: ESOPHAGOGASTRODUODENOSCOPY (EGD) WITH PROPOFOL ;  Surgeon: Stacia Glendia BRAVO, MD;  Location: [facility] ENDOSCOPY;  Service: Gastroenterology;  Laterality: N/A;   GIVENS CAPSULE STUDY N/A 11/27/2022   Procedure: GIVENS CAPSULE STUDY;  Surgeon: Shila Gustav GAILS, MD;  Location: [facility] ENDOSCOPY;  Service: Gastroenterology;  Laterality: N/A;   GIVENS CAPSULE STUDY N/A 12/14/2022   Procedure: GIVENS CAPSULE STUDY;  Surgeon: Stacia Glendia BRAVO, MD;  Location: [facility] ENDOSCOPY;  Service: Gastroenterology;  Laterality: N/A;   MALONEY DILATION  11/27/2022   Procedure: MALONEY DILATION;  Surgeon: Shila Gustav GAILS, MD;  Location: [facility] ENDOSCOPY;  Service: Gastroenterology;;  54   POLYPECTOMY  08/21/2022   Procedure: POLYPECTOMY;  Surgeon: Stacia Glendia BRAVO, MD;  Location: [facility] ENDOSCOPY;  Service: Gastroenterology;;   POLYPECTOMY  12/14/2022   Procedure: POLYPECTOMY;  Surgeon: Stacia Glendia BRAVO, MD;  Location: [facility] ENDOSCOPY;  Service: Gastroenterology;;    SOCIAL HISTORY: Social History   Socioeconomic History   Marital status: Divorced    Spouse name: Not on file   Number of children: 1   Years of education: Not on file   Highest education level: 11th grade  Occupational History   Not on file  Tobacco Use   Smoking status: Never   Smokeless tobacco: Never  Substance and Sexual Activity   Alcohol use: Yes    Alcohol/week: 15.0 standard drinks of alcohol    Types: 15 Cans of beer per week    Comment: 2-3 beers daily   Drug use: Never   Sexual activity: Not Currently  Other Topics Concern   Not on file  Social History Narrative   Not on file   Social Drivers of Health   Financial Resource Strain: Low Risk  (12/07/2022)   Overall Financial Resource Strain  (CARDIA)    Difficulty of Paying Living Expenses: Not very hard  Food Insecurity: No Food Insecurity (12/07/2022)   Hunger Vital Sign    Worried About Running Out of Food in the Last Year: Never true    Ran Out of Food in the Last Year: Never true  Transportation Needs: No Transportation Needs (12/07/2022)   PRAPARE - Administrator, Civil Service (Medical): No    Lack of Transportation (Non-Medical): No  Physical Activity: Inactive (12/07/2022)   Exercise Vital Sign    Days of Exercise per Week: 1 day    Minutes of Exercise per Session: 0 min  Stress: Stress Concern Present (12/07/2022)   [employer] of Occupational Health - Occupational Stress Questionnaire    Feeling of Stress : To some extent  Social Connections: Moderately Isolated (12/07/2022)   Social Connection and Isolation Panel    Frequency of Communication with Friends and Family: Three times a week    Frequency of Social Gatherings with Friends and Family: Once a week    Attends Religious Services: 1 to 4 times per year    Active Member of [employer] or Organizations: No    Attends Banker Meetings: Not on file    Marital Status: Divorced  Intimate Partner Violence: Not At Risk (11/25/2022)   Humiliation, Afraid, Rape, and Kick questionnaire    Fear of Current or Ex-Partner: No  Emotionally Abused: No    Physically Abused: No    Sexually Abused: No    FAMILY HISTORY: Family History  Problem Relation Age of Onset   Hypertension Father    Diabetes Father    Colon cancer Neg Hx    Rectal cancer Neg Hx     ALLERGIES:  has no known allergies.  MEDICATIONS:  Current Outpatient Medications  Medication Sig Dispense Refill   amoxicillin -clavulanate (AUGMENTIN ) 875-125 MG tablet Take 1 tablet by mouth every 12 (twelve) hours. (Patient not taking: Reported on 12/01/2023) 14 tablet 0   B Complex Vitamins (VITAMIN B COMPLEX) TABS Take 1 tablet by mouth daily.     CALCIUM PO Take 1 tablet by mouth daily.  (Patient not taking: Reported on 12/01/2023)     ferrous sulfate  325 (65 FE) MG EC tablet Take 1 tablet (325 mg total) by mouth 2 (two) times daily. 180 tablet 0   omeprazole  (PRILOSEC) 40 MG capsule Take 40 mg by mouth daily.     pantoprazole  (PROTONIX ) 40 MG tablet Take 1 tablet (40 mg total) by mouth 2 (two) times daily. 120 tablet 0   sucralfate  (CARAFATE ) 1 g tablet Take 1 tablet (1 g total) by mouth 2 (two) times daily for 7 days. 14 tablet 0   No current facility-administered medications for this visit.    REVIEW OF SYSTEMS:   Constitutional: Denies fatigue, weight loss, fevers, chills or abnormal night sweats Eyes: Denies blurriness of vision, double vision or watery eyes Ears, nose, mouth, throat, and face: Denies epistaxis, mucositis or sore throat Respiratory: Denies cough, dyspnea or wheezes Cardiovascular: Denies palpitation, chest discomfort or lower extremity swelling Gastrointestinal:  Denies nausea, heartburn or change in bowel habits (+) mild intermittent hematochezia Skin: Denies abnormal skin rashes Lymphatics: Denies new lymphadenopathy or easy bruising Neurological:Denies numbness, tingling or new weaknesses Behavioral/Psych: Mood is stable, no new changes  All other systems were reviewed with the patient and are negative.  PHYSICAL EXAMINATION: ECOG PERFORMANCE STATUS: 0 - Asymptomatic  Vitals:   12/21/23 1218  BP: 120/66  Pulse: 87  Resp: 17  Temp: 98.4 F (36.9 C)  SpO2: 99%   Filed Weights   12/21/23 1218  Weight: 183 lb 6.4 oz (83.2 kg)    GENERAL:alert, no distress and comfortable SKIN: no rashes or significant lesions EYES: sclera clear NECK: without mass LYMPH:  no palpable cervical or supraclavicular lymphadenopathy  LUNGS: clear, with normal breathing effort HEART: regular rate & rhythm, no lower extremity edema ABDOMEN: abdomen soft, non-tender and normal bowel sounds Musculoskeletal: no cyanosis of digits and no clubbing  PSYCH: alert &  oriented x 3 with fluent speech NEURO: no focal motor/sensory deficits  LABORATORY DATA:  I have reviewed the data as listed    Latest Ref Rng & Units 12/01/2023    9:02 AM 12/10/2022    1:05 PM 12/07/2022    4:05 PM  CBC  WBC 4.0 - 10.5 K/uL 5.5  4.4  5.4   Hemoglobin 13.0 - 17.0 g/dL 89.0  8.6  8.9 Repeated and verified X2.   Hematocrit 39.0 - 52.0 % 35.6  30.3  29.8   Platelets 150.0 - 400.0 K/uL 320.0  383  323.0        Latest Ref Rng & Units 12/01/2023    9:02 AM 12/10/2022    1:05 PM 12/07/2022    4:05 PM  CMP  Glucose 70 - 99 mg/dL 98  872  884   BUN 6 - 23  mg/dL 20  11  12    Creatinine 0.40 - 1.50 mg/dL 9.22  9.10  9.17   Sodium 135 - 145 mEq/L 136  136  138   Potassium 3.5 - 5.1 mEq/L 4.1  3.9  4.1   Chloride 96 - 112 mEq/L 102  104  102   CO2 19 - 32 mEq/L 27  25  28    Calcium 8.4 - 10.5 mg/dL 9.5  8.6  8.8   Total Protein 6.0 - 8.3 g/dL 7.9   7.4   Total Bilirubin 0.2 - 1.2 mg/dL 0.6   0.3   Alkaline Phos 39 - 117 U/L 103   98   AST 0 - 37 U/L 28   16   ALT 0 - 53 U/L 30   14      RADIOGRAPHIC STUDIES: I have personally reviewed the radiological images as listed and agreed with the findings in the report. No results found.  ASSESSMENT & PLAN: 58 yo male with   Microcytic anemia, secondary to iron deficiency from GI blood loss -We reviewed his medical record in detail with the patient.  -Presented with hematochezia, work up showed IDA, lowest hgb 5.8, ferritin 1.  -MCV normal in 2018, this is not thalassemia. Normal B12 and folate -Multiple endoscopies showed a friable nodule in the proximal anus (08/2018), multiple gastric polyps (08/2022), and small bowel erosion with fresh heme (11/2022); it was also felt he could have chronic bleeding from large gastric polyps and/or internal hemorrhoids -More recently he presented with joint pain which he attributes to IDA, Hgb 10.9 with persistent iron deficiency. Currently on supplement which reportedly stopped the bleeding, iron  rich diet, and OTC oral iron x2 weeks, tolerating well. Also avoids triggers for rectal bleeding such as spicy foods and heavy alcohol -He is otherwise asymptomatic of anemia. Will repeat CBC/iron studies to see if oral iron has helped. Recommend taking with vit C source -Will proceed with IV iron replacement, monoferric if approved, otherwise will give Venofer at [facility], patient tolerated infusions in the past and agrees to proceed.  -Repeat iron studies 4 weeks post infusion to evaluate his response -Labs q2-3 months -F/up in 6 months -Pt seen with Dr. Federico   PLAN: -Medical record including endoscopies and labs reviewed -CBC/iron panel today to evaluate response to oral iron -Continue oral iron with Vit C source -[facility]erric x1 at [facility] next week (Venofer if insurance denies) -Lab 4 weeks post infusion to evaluate response -Labs q2-3 months, will arrange additional IV iron as needed -F/up in 6 months -Pt seen with Dr. Federico    Orders Placed This Encounter  Procedures   CBC with Differential (Cancer Center Only)    Standing Status:   Future    Number of Occurrences:   1    Expiration Date:   12/20/2024   Ferritin    Standing Status:   Future    Number of Occurrences:   1    Expiration Date:   12/20/2024   Iron and Iron Binding Capacity ([facility] only)    Standing Status:   Future    Number of Occurrences:   1    Expiration Date:   12/20/2024     All questions were answered. The patient knows to call the clinic with any problems, questions or concerns.      Janyra Barillas K Phillis Thackeray, NP 12/21/23

## 2023-12-21 ENCOUNTER — Inpatient Hospital Stay: Attending: Nurse Practitioner | Admitting: Nurse Practitioner

## 2023-12-21 ENCOUNTER — Inpatient Hospital Stay

## 2023-12-21 ENCOUNTER — Encounter: Payer: Self-pay | Admitting: Nurse Practitioner

## 2023-12-21 VITALS — BP 120/66 | HR 87 | Temp 98.4°F | Resp 17 | Ht 65.0 in | Wt 183.4 lb

## 2023-12-21 DIAGNOSIS — D509 Iron deficiency anemia, unspecified: Secondary | ICD-10-CM

## 2023-12-21 LAB — CBC WITH DIFFERENTIAL (CANCER CENTER ONLY)
Abs Immature Granulocytes: 0.02 10*3/uL (ref 0.00–0.07)
Basophils Absolute: 0.1 10*3/uL (ref 0.0–0.1)
Basophils Relative: 1 %
Eosinophils Absolute: 0.1 10*3/uL (ref 0.0–0.5)
Eosinophils Relative: 2 %
HCT: 39.9 % (ref 39.0–52.0)
Hemoglobin: 11.9 g/dL — ABNORMAL LOW (ref 13.0–17.0)
Immature Granulocytes: 0 %
Lymphocytes Relative: 28 %
Lymphs Abs: 1.4 10*3/uL (ref 0.7–4.0)
MCH: 20.7 pg — ABNORMAL LOW (ref 26.0–34.0)
MCHC: 29.8 g/dL — ABNORMAL LOW (ref 30.0–36.0)
MCV: 69.5 fL — ABNORMAL LOW (ref 80.0–100.0)
Monocytes Absolute: 0.5 10*3/uL (ref 0.1–1.0)
Monocytes Relative: 10 %
Neutro Abs: 2.9 10*3/uL (ref 1.7–7.7)
Neutrophils Relative %: 59 %
Platelet Count: 301 10*3/uL (ref 150–400)
RBC: 5.74 MIL/uL (ref 4.22–5.81)
RDW: 25.2 % — ABNORMAL HIGH (ref 11.5–15.5)
WBC Count: 4.9 10*3/uL (ref 4.0–10.5)
nRBC: 0 % (ref 0.0–0.2)

## 2023-12-21 LAB — FERRITIN: Ferritin: 31 ng/mL (ref 24–336)

## 2023-12-21 LAB — IRON AND IRON BINDING CAPACITY (CC-WL,HP ONLY)
Iron: 46 ug/dL (ref 45–182)
Saturation Ratios: 8 % — ABNORMAL LOW (ref 17.9–39.5)
TIBC: 545 ug/dL — ABNORMAL HIGH (ref 250–450)
UIBC: 499 ug/dL — ABNORMAL HIGH (ref 117–376)

## 2023-12-22 ENCOUNTER — Other Ambulatory Visit: Payer: Self-pay | Admitting: Nurse Practitioner

## 2023-12-22 ENCOUNTER — Telehealth: Payer: Self-pay | Admitting: Nurse Practitioner

## 2023-12-22 ENCOUNTER — Telehealth: Payer: Self-pay

## 2023-12-22 NOTE — Telephone Encounter (Signed)
 Scheduled appointments per 6/17 los. Talked with the patient and he is aware of all made appointments.

## 2023-12-22 NOTE — Telephone Encounter (Signed)
 Lacie, patient will be scheduled as soon as possible.  Auth Submission: NO AUTH NEEDED Site of care: Site of care: [facility] Payer: [employer] commercial Medication & CPT/J Code(s) submitted: Venofer (Iron Sucrose) J1756 Diagnosis Code:  Route of submission (phone, fax, portal):  Phone # Fax # Auth type: Buy/Bill PB Units/visits requested: 200mg  x 5 doses Reference number:  Approval from: 12/22/23 to 04/22/24

## 2023-12-29 ENCOUNTER — Ambulatory Visit: Admitting: Nurse Practitioner

## 2023-12-29 ENCOUNTER — Ambulatory Visit

## 2023-12-29 VITALS — BP 118/74 | HR 77 | Temp 98.3°F | Resp 16 | Ht 65.0 in | Wt 184.6 lb

## 2023-12-29 DIAGNOSIS — D500 Iron deficiency anemia secondary to blood loss (chronic): Secondary | ICD-10-CM

## 2023-12-29 DIAGNOSIS — D509 Iron deficiency anemia, unspecified: Secondary | ICD-10-CM

## 2023-12-29 MED ORDER — IRON SUCROSE 20 MG/ML IV SOLN
200.0000 mg | Freq: Once | INTRAVENOUS | Status: AC
  Administered 2023-12-29: 200 mg via INTRAVENOUS
  Filled 2023-12-29: qty 10

## 2023-12-29 NOTE — Progress Notes (Signed)
 Diagnosis: Iron Deficiency Anemia  Provider:  Chilton Greathouse MD  Procedure: IV Push  IV Type: Peripheral, IV Location: L Antecubital  Venofer (Iron Sucrose), Dose: 200 mg  Post Infusion IV Care: Observation period completed and Peripheral IV Discontinued  Discharge: Condition: Good, Destination: Home . AVS Provided  Performed by:  Rico Ala, LPN

## 2023-12-30 ENCOUNTER — Encounter: Payer: Self-pay | Admitting: Nurse Practitioner

## 2023-12-31 ENCOUNTER — Ambulatory Visit (INDEPENDENT_AMBULATORY_CARE_PROVIDER_SITE_OTHER)

## 2023-12-31 VITALS — BP 129/78 | HR 74 | Temp 98.1°F | Resp 16 | Ht 65.0 in | Wt 183.6 lb

## 2023-12-31 DIAGNOSIS — D509 Iron deficiency anemia, unspecified: Secondary | ICD-10-CM

## 2023-12-31 DIAGNOSIS — D500 Iron deficiency anemia secondary to blood loss (chronic): Secondary | ICD-10-CM

## 2023-12-31 MED ORDER — IRON SUCROSE 20 MG/ML IV SOLN
200.0000 mg | Freq: Once | INTRAVENOUS | Status: AC
  Administered 2023-12-31: 200 mg via INTRAVENOUS
  Filled 2023-12-31: qty 10

## 2023-12-31 NOTE — Progress Notes (Signed)
 Diagnosis: Iron  Deficiency Anemia  Provider:  Praveen Mannam MD  Procedure: IV Push  IV Type: Peripheral, IV Location: R Antecubital  Venofer  (Iron  Sucrose), Dose: 200 mg  Post Infusion IV Care: Peripheral IV Discontinued pt only requested to wait 10 minutes  Discharge: Condition: Good, Destination: Home . AVS Provided  Performed by:  Leita FORBES Miles, LPN

## 2024-01-03 ENCOUNTER — Ambulatory Visit

## 2024-01-03 VITALS — BP 157/80 | HR 106 | Temp 98.4°F | Resp 16 | Ht 65.0 in | Wt 185.4 lb

## 2024-01-03 DIAGNOSIS — D509 Iron deficiency anemia, unspecified: Secondary | ICD-10-CM

## 2024-01-03 DIAGNOSIS — D500 Iron deficiency anemia secondary to blood loss (chronic): Secondary | ICD-10-CM

## 2024-01-03 MED ORDER — IRON SUCROSE 20 MG/ML IV SOLN
200.0000 mg | Freq: Once | INTRAVENOUS | Status: AC
  Administered 2024-01-03: 200 mg via INTRAVENOUS
  Filled 2024-01-03: qty 10

## 2024-01-03 NOTE — Progress Notes (Signed)
 Diagnosis: Iron  Deficiency Anemia  Provider:  Praveen Mannam MD  Procedure: IV Push  IV Type: Peripheral, IV Location: L Antecubital  Venofer  (Iron  Sucrose), Dose: 200 mg  Post Infusion IV Care: Observation period completed  Discharge: Condition: Good, Destination: Home . AVS Declined  Performed by:  Rachelle Bue, RN

## 2024-01-05 ENCOUNTER — Ambulatory Visit: Admitting: *Deleted

## 2024-01-05 VITALS — BP 138/87 | HR 73 | Temp 98.1°F | Resp 16 | Ht 65.0 in | Wt 182.4 lb

## 2024-01-05 DIAGNOSIS — D500 Iron deficiency anemia secondary to blood loss (chronic): Secondary | ICD-10-CM

## 2024-01-05 DIAGNOSIS — D509 Iron deficiency anemia, unspecified: Secondary | ICD-10-CM | POA: Diagnosis not present

## 2024-01-05 MED ORDER — IRON SUCROSE 20 MG/ML IV SOLN
200.0000 mg | Freq: Once | INTRAVENOUS | Status: AC
  Administered 2024-01-05: 200 mg via INTRAVENOUS
  Filled 2024-01-05: qty 10

## 2024-01-05 NOTE — Progress Notes (Signed)
 Diagnosis: Acute Anemia  Provider:  Praveen Mannam MD  Procedure: IV Push  IV Type: Peripheral, IV Location: R Antecubital  Venofer  (Iron  Sucrose), Dose: 200 mg  Post Infusion IV Care: Observation period completed  Discharge: Condition: Good, Destination: Home . AVS Declined  Performed by:  Devonne Folk, RN

## 2024-01-10 ENCOUNTER — Ambulatory Visit: Admitting: *Deleted

## 2024-01-10 VITALS — BP 126/84 | HR 82 | Temp 97.7°F | Resp 16 | Ht 65.0 in | Wt 184.8 lb

## 2024-01-10 DIAGNOSIS — D509 Iron deficiency anemia, unspecified: Secondary | ICD-10-CM

## 2024-01-10 DIAGNOSIS — D500 Iron deficiency anemia secondary to blood loss (chronic): Secondary | ICD-10-CM

## 2024-01-10 MED ORDER — IRON SUCROSE 20 MG/ML IV SOLN
200.0000 mg | Freq: Once | INTRAVENOUS | Status: AC
  Administered 2024-01-10: 200 mg via INTRAVENOUS
  Filled 2024-01-10: qty 10

## 2024-01-10 NOTE — Progress Notes (Signed)
 Diagnosis: Iron  Deficiency Anemia  Provider:  Mannam, Praveen MD  Procedure: IV Push  IV Type: Peripheral, IV Location: L Upper Arm  Venofer  (Iron  Sucrose), Dose: 200 mg  Post Infusion IV Care: Observation period completed and Peripheral IV Discontinued  Discharge: Condition: Good, Destination: Home . AVS Provided  Performed by:  Trudy Lamarr LABOR, RN

## 2024-01-19 ENCOUNTER — Other Ambulatory Visit: Payer: Self-pay | Admitting: *Deleted

## 2024-01-20 ENCOUNTER — Inpatient Hospital Stay: Attending: Nurse Practitioner

## 2024-02-22 ENCOUNTER — Encounter: Payer: Self-pay | Admitting: Emergency Medicine

## 2024-02-22 ENCOUNTER — Ambulatory Visit: Payer: Self-pay | Admitting: Emergency Medicine

## 2024-02-22 ENCOUNTER — Ambulatory Visit: Admitting: Emergency Medicine

## 2024-02-22 VITALS — BP 122/80 | HR 93 | Temp 98.1°F | Ht 65.0 in | Wt 180.0 lb

## 2024-02-22 DIAGNOSIS — D509 Iron deficiency anemia, unspecified: Secondary | ICD-10-CM

## 2024-02-22 DIAGNOSIS — M2550 Pain in unspecified joint: Secondary | ICD-10-CM

## 2024-02-22 DIAGNOSIS — F419 Anxiety disorder, unspecified: Secondary | ICD-10-CM

## 2024-02-22 LAB — CBC WITH DIFFERENTIAL/PLATELET
Basophils Absolute: 0 K/uL (ref 0.0–0.1)
Basophils Relative: 0.6 % (ref 0.0–3.0)
Eosinophils Absolute: 0.1 K/uL (ref 0.0–0.7)
Eosinophils Relative: 1.2 % (ref 0.0–5.0)
HCT: 47.9 % (ref 39.0–52.0)
Hemoglobin: 15.8 g/dL (ref 13.0–17.0)
Lymphocytes Relative: 23.9 % (ref 12.0–46.0)
Lymphs Abs: 1.2 K/uL (ref 0.7–4.0)
MCHC: 33.1 g/dL (ref 30.0–36.0)
MCV: 81.5 fl (ref 78.0–100.0)
Monocytes Absolute: 0.4 K/uL (ref 0.1–1.0)
Monocytes Relative: 7.6 % (ref 3.0–12.0)
Neutro Abs: 3.5 K/uL (ref 1.4–7.7)
Neutrophils Relative %: 66.7 % (ref 43.0–77.0)
Platelets: 220 K/uL (ref 150.0–400.0)
RBC: 5.88 Mil/uL — ABNORMAL HIGH (ref 4.22–5.81)
RDW: 21.7 % — ABNORMAL HIGH (ref 11.5–15.5)
WBC: 5.2 K/uL (ref 4.0–10.5)

## 2024-02-22 LAB — COMPREHENSIVE METABOLIC PANEL WITH GFR
ALT: 27 U/L (ref 0–53)
AST: 20 U/L (ref 0–37)
Albumin: 4.4 g/dL (ref 3.5–5.2)
Alkaline Phosphatase: 104 U/L (ref 39–117)
BUN: 13 mg/dL (ref 6–23)
CO2: 26 meq/L (ref 19–32)
Calcium: 9.2 mg/dL (ref 8.4–10.5)
Chloride: 104 meq/L (ref 96–112)
Creatinine, Ser: 0.94 mg/dL (ref 0.40–1.50)
GFR: 90.5 mL/min (ref >60.00)
Glucose, Bld: 106 mg/dL — ABNORMAL HIGH (ref 70–99)
Potassium: 3.9 meq/L (ref 3.5–5.1)
Sodium: 140 meq/L (ref 135–145)
Total Bilirubin: 0.6 mg/dL (ref 0.2–1.2)
Total Protein: 7.5 g/dL (ref 6.0–8.3)

## 2024-02-22 LAB — FERRITIN: Ferritin: 67.7 ng/mL (ref 22.0–322.0)

## 2024-02-22 LAB — VITAMIN D 25 HYDROXY (VIT D DEFICIENCY, FRACTURES): VITD: 29.23 ng/mL — ABNORMAL LOW (ref 30.00–100.00)

## 2024-02-22 LAB — SEDIMENTATION RATE: Sed Rate: 10 mm/h (ref 0–20)

## 2024-02-22 LAB — VITAMIN B12: Vitamin B-12: 1223 pg/mL — ABNORMAL HIGH (ref 211–911)

## 2024-02-22 LAB — URIC ACID: Uric Acid, Serum: 6.7 mg/dL (ref 4.0–7.8)

## 2024-02-22 LAB — TSH: TSH: 1.61 u[IU]/mL (ref 0.35–5.50)

## 2024-02-22 MED ORDER — DULOXETINE HCL 30 MG PO CPEP: 30.0000 mg | ORAL_CAPSULE | Freq: Every day | ORAL | 3 refills | Status: AC

## 2024-02-22 NOTE — Assessment & Plan Note (Signed)
 Stable.  Getting iron  infusion therapy Stable CBC No concerns.

## 2024-02-22 NOTE — Assessment & Plan Note (Signed)
 Chronic and presently active and affecting quality of life Recommend Cymbalta  30 mg daily which will also help with some of the joint pains and possible neuropathic pain

## 2024-02-22 NOTE — Assessment & Plan Note (Signed)
 Clinically stable.  No red flag signs or symptoms. Differential diagnosis discussed.  No synovitis or deformities on physical examination Recommend rheumatology blood work today May need rheumatology evaluation Recommend to start Cymbalta  30 mg daily which will also help with his chronic anxiety Pain management discussed.  Recommend Tylenol .  Advised to avoid NSAIDs due to chronic GI bleeding problems

## 2024-02-22 NOTE — Patient Instructions (Signed)
 Dolor en las articulaciones Joint Pain  Varias pueden ser las causas del dolor en las articulaciones. El dolor puede desaparecer si usted sigue las indicaciones del mdico para Public house manager. A veces, es posible que necesite ms tratamiento. Las causas del dolor en las articulaciones pueden ser las siguientes: Moretones en la zona de la articulacin. Una lesin causada por movimientos repetidos. Desgaste de la articulacin a medida que envejece. Acumulacin de cristales de cido rico en la articulacin. Esto tambin se denomina "gota". Irritacin e hinchazn de la articulacin. Tipos de artritis. Infecciones de la articulacin o del hueso. El mdico puede indicarle que tome analgsicos o que use una venda elstica, un cabestrillo o una frula. Si el dolor en la articulacin contina, es posible que necesite realizarse anlisis de laboratorio o estudios de diagnstico por imgenes para Emergency planning/management officer causa del dolor en la articulacin. Siga estas indicaciones en su casa: Si tiene una venda elstica, un cabestrillo o una frula que se puede retirar: Use la venda, el cabestrillo o la frula como se lo haya indicado el mdico. Quteselos solo si el mdico lo autoriza. Controle todos los das la piel que est abajo y a su alrededor. Informe al mdico si observa problemas. Afljelos si siente hormigueo en los dedos de la mano o del pie, o si se le entumecen, se le enfran o se ponen de color azul. Mantngalos limpios y secos. Pregntele al mdico si debera quitrselos antes de baarse. Si la venda, el cabestrillo o la frula no son impermeables: No deje que se mojen. Cbralos para ducharse o baarse. Use una cubierta que no permita que [place]. Control del dolor, la rigidez y la hinchazn     Si se lo indican, aplique hielo sobre la zona. Si tiene [employer], un cabestrillo o una frula que se puede sacar, quteselos como se lo hayan indicado. Ponga el hielo en una bolsa  plstica. Coloque una toalla entre la piel y la bolsa. Aplique el hielo durante 20 minutos, 2 o 3 veces por da. Si se lo indican, aplique calor sobre la zona. Hgalo con la frecuencia que le hayan indicado. Use la fuente de calor que el mdico le recomiende, como una compresa de calor hmedo o una almohadilla trmica. Coloque una toalla entre la piel y la fuente de calor. Aplique calor durante 20 a 30 minutos. Si la piel se le pone de color rojo brillante, quite el hielo o el calor de inmediato para evitar daos en la piel. El Struble de dao es mayor si no puede sentir dolor, Airline pilot o fro. Mueva los dedos de las manos o de los pies con frecuencia para reducir la rigidez y la hinchazn. Cuando est sentado o acostado, mantenga la zona lesionada por encima del nivel del corazn. Use una almohada para sostener la zona con dolor, segn sea necesario. Actividad Ponga la articulacin con dolor en reposo como se lo hayan indicado. No haga cosas que le causen dolor o que lo intensifiquen. Comience a ejercitar o a estirar la zona afectada como se lo haya indicado el mdico. Retome a las actividades habituales cuando se lo indiquen. Pregunte qu cosas son seguras para que haga. Indicaciones generales Use los medicamentos como se lo haya indicado el mdico. El tratamiento puede incluir medicamentos para Chief Technology Officer y la hinchazn que se toman por la boca o que se aplican sobre la piel. No fume ni use vapeadores o productos que tengan nicotina o tabaco. Si necesita ayuda para  dejar de consumir estos productos, hable con su mdico. Concurra a todas las visitas de seguimiento. El mdico querr controlar su afeccin. Comunquese con un mdico si: El dolor no se alivia con medicamentos. El dolor en la articulacin no mejora en el trmino de 3 das. Tiene ms hinchazn o moretones. Tiene fiebre. Pierde 10 libras (4.5 kg) o ms, sin proponrselo. Solicite ayuda de inmediato si: No puede mover la  articulacin. Si los dedos de la mano o del pie se le entumecen, si siente hormigueo o si se le enfran y se tornan de Research officer, trade union. Tiene fiebre y burkina faso articulacin que est roja e inflamada, y se siente caliente al tacto. Esta informacin no tiene Theme park manager el consejo del mdico. Asegrese de hacerle al mdico cualquier pregunta que tenga. Document Revised: 10/28/2022 Document Reviewed: 10/28/2022 Elsevier Patient Education  2024 [employer].

## 2024-02-22 NOTE — Progress Notes (Signed)
 Troy Stevenson 58 y.o.   Chief Complaint  Patient presents with   Joint Pain    Patient here for joint pain that has been on going for 4-5 months. Patient states he was getting the iron  infusion and seemed to helped then the pain came back. Patient is only taking tylenol  to help      HISTORY OF PRESENT ILLNESS: This is a 58 y.o. male complaining of generalized joint pains for the past 4 to 5 months Has a history of chronic anemia.  Gets frequent iron  infusions.  Much improved. Has a history of chronic anxiety for the last 20 to 30 years.  Not presently getting any treatment. No other complaints or medical concerns today.  HPI   Prior to Admission medications   Medication Sig Start Date End Date Taking? Authorizing Provider  B Complex Vitamins (VITAMIN B COMPLEX) TABS Take 1 tablet by mouth daily.   Yes [provider]  omeprazole  (PRILOSEC) 40 MG capsule Take 40 mg by mouth daily. 02/25/18  Yes [provider]    No Known Allergies  Patient Active Problem List   Diagnosis Date Noted   Chronic blood loss anemia 12/07/2022   Diverticulosis 12/07/2022   Internal hemorrhoids 12/07/2022   Chronic GI bleeding 11/25/2022   Chronic iron  deficiency anemia 08/21/2022   Fundic gland polyposis of stomach 08/21/2022   Symptomatic anemia 08/19/2022   Chronic anxiety 02/24/2013    Past Medical History:  Diagnosis Date   Anemia Aug 19 2022   Anxiety    Blood transfusion without reported diagnosis Feb14 2024   Hepatitis     Past Surgical History:  Procedure Laterality Date   BIOPSY  08/21/2022   Procedure: BIOPSY;  Surgeon: Stacia Glendia BRAVO, MD;  Location: [facility] ENDOSCOPY;  Service: Gastroenterology;;   COLONOSCOPY WITH PROPOFOL  N/A 08/21/2022   Procedure: COLONOSCOPY WITH PROPOFOL ;  Surgeon: Stacia Glendia BRAVO, MD;  Location: [facility] ENDOSCOPY;  Service: Gastroenterology;  Laterality: N/A;   ESOPHAGOGASTRODUODENOSCOPY N/A 11/27/2022   Procedure:  ESOPHAGOGASTRODUODENOSCOPY (EGD);  Surgeon: Nandigam, Kavitha V, MD;  Location: [facility] ENDOSCOPY;  Service: Gastroenterology;  Laterality: N/A;   ESOPHAGOGASTRODUODENOSCOPY (EGD) WITH PROPOFOL  N/A 08/21/2022   Procedure: ESOPHAGOGASTRODUODENOSCOPY (EGD) WITH PROPOFOL ;  Surgeon: Stacia Glendia BRAVO, MD;  Location: [facility] ENDOSCOPY;  Service: Gastroenterology;  Laterality: N/A;   ESOPHAGOGASTRODUODENOSCOPY (EGD) WITH PROPOFOL  N/A 12/14/2022   Procedure: ESOPHAGOGASTRODUODENOSCOPY (EGD) WITH PROPOFOL ;  Surgeon: Stacia Glendia BRAVO, MD;  Location: [facility] ENDOSCOPY;  Service: Gastroenterology;  Laterality: N/A;   GIVENS CAPSULE STUDY N/A 11/27/2022   Procedure: GIVENS CAPSULE STUDY;  Surgeon: Shila Gustav GAILS, MD;  Location: [facility] ENDOSCOPY;  Service: Gastroenterology;  Laterality: N/A;   GIVENS CAPSULE STUDY N/A 12/14/2022   Procedure: GIVENS CAPSULE STUDY;  Surgeon: Stacia Glendia BRAVO, MD;  Location: [facility] ENDOSCOPY;  Service: Gastroenterology;  Laterality: N/A;   MALONEY DILATION  11/27/2022   Procedure: MALONEY DILATION;  Surgeon: Shila Gustav GAILS, MD;  Location: [facility] ENDOSCOPY;  Service: Gastroenterology;;  55   POLYPECTOMY  08/21/2022   Procedure: POLYPECTOMY;  Surgeon: Stacia Glendia BRAVO, MD;  Location: [facility] ENDOSCOPY;  Service: Gastroenterology;;   POLYPECTOMY  12/14/2022   Procedure: POLYPECTOMY;  Surgeon: Stacia Glendia BRAVO, MD;  Location: [facility] ENDOSCOPY;  Service: Gastroenterology;;    Social History   Socioeconomic History   Marital status: Divorced    Spouse name: Not on file   Number of children: 1   Years of education: Not on file   Highest education level: 11th grade  Occupational History  Not on file  Tobacco Use   Smoking status: Never   Smokeless tobacco: Never  Substance and Sexual Activity   Alcohol use: Yes    Alcohol/week: 15.0 standard drinks of alcohol    Types: 15 Cans of beer per week    Comment: 2-3 beers daily   Drug use: Never   Sexual activity: Not Currently  Other  Topics Concern   Not on file  Social History Narrative   Not on file   Social Drivers of Health   Financial Resource Strain: Low Risk  (12/07/2022)   Overall Financial Resource Strain (CARDIA)    Difficulty of Paying Living Expenses: Not very hard  Food Insecurity: No Food Insecurity (12/07/2022)   Hunger Vital Sign    Worried About Running Out of Food in the Last Year: Never true    Ran Out of Food in the Last Year: Never true  Transportation Needs: No Transportation Needs (12/07/2022)   PRAPARE - Administrator, Civil Service (Medical): No    Lack of Transportation (Non-Medical): No  Physical Activity: Inactive (12/07/2022)   Exercise Vital Sign    Days of Exercise per Week: 1 day    Minutes of Exercise per Session: 0 min  Stress: Stress Concern Present (12/07/2022)   [employer] of Occupational Health - Occupational Stress Questionnaire    Feeling of Stress : To some extent  Social Connections: Moderately Isolated (12/07/2022)   Social Connection and Isolation Panel    Frequency of Communication with Friends and Family: Three times a week    Frequency of Social Gatherings with Friends and Family: Once a week    Attends Religious Services: 1 to 4 times per year    Active Member of [employer] or Organizations: No    Attends Engineer, structural: Not on file    Marital Status: Divorced  Intimate Partner Violence: Not At Risk (11/25/2022)   Humiliation, Afraid, Rape, and Kick questionnaire    Fear of Current or Ex-Partner: No    Emotionally Abused: No    Physically Abused: No    Sexually Abused: No    Family History  Problem Relation Age of Onset   Hypertension Father    Diabetes Father    Colon cancer Neg Hx    Rectal cancer Neg Hx      Review of Systems  Constitutional: Negative.  Negative for chills and fever.  HENT: Negative.  Negative for congestion and sore throat.   Respiratory: Negative.  Negative for cough and shortness of breath.    Cardiovascular: Negative.  Negative for chest pain and palpitations.  Gastrointestinal:  Negative for abdominal pain, diarrhea, nausea and vomiting.  Musculoskeletal:  Positive for joint pain.  Skin: Negative.  Negative for rash.  Neurological: Negative.  Negative for dizziness and headaches.  Psychiatric/Behavioral:  The patient is nervous/anxious.   All other systems reviewed and are negative.   Vitals:   02/22/24 1039  BP: 122/80  Pulse: 93  Temp: 98.1 F (36.7 C)  SpO2: 96%    Physical Exam Vitals reviewed.  Constitutional:      Appearance: Normal appearance.  HENT:     Head: Normocephalic.     Mouth/Throat:     Mouth: Mucous membranes are moist.     Pharynx: Oropharynx is clear.  Eyes:     Extraocular Movements: Extraocular movements intact.     Conjunctiva/sclera: Conjunctivae normal.     Pupils: Pupils are equal, round, and reactive to light.  Cardiovascular:  Rate and Rhythm: Normal rate and regular rhythm.     Pulses: Normal pulses.     Heart sounds: Normal heart sounds.  Pulmonary:     Effort: Pulmonary effort is normal.     Breath sounds: Normal breath sounds.  Abdominal:     Palpations: Abdomen is soft.     Tenderness: There is no abdominal tenderness.  Musculoskeletal:        General: No tenderness or deformity.     Cervical back: No tenderness.  Lymphadenopathy:     Cervical: No cervical adenopathy.  Neurological:     General: No focal deficit present.     Mental Status: He is alert and oriented to person, place, and time.  Psychiatric:        Mood and Affect: Mood normal.        Behavior: Behavior normal.      ASSESSMENT & PLAN: A total of 43 minutes was spent with the patient and counseling/coordination of care regarding preparing for this visit, review of most recent office visit notes, review of multiple chronic medical conditions and their management, mental health management and need for antianxiety medication, pain management, review  of all medications, review of most recent bloodwork results, review of health maintenance items, education on nutrition, prognosis, documentation, and need for follow up.   Problem List Items Addressed This Visit       Other   Chronic anxiety   Chronic and presently active and affecting quality of life Recommend Cymbalta  30 mg daily which will also help with some of the joint pains and possible neuropathic pain      Relevant Medications   DULoxetine  (CYMBALTA ) 30 MG capsule   Chronic iron  deficiency anemia   Stable.  Getting iron  infusion therapy Stable CBC No concerns.      Relevant Orders   CBC with Differential/Platelet   Ferritin   Arthralgia of multiple joints - Primary   Clinically stable.  No red flag signs or symptoms. Differential diagnosis discussed.  No synovitis or deformities on physical examination Recommend rheumatology blood work today May need rheumatology evaluation Recommend to start Cymbalta  30 mg daily which will also help with his chronic anxiety Pain management discussed.  Recommend Tylenol .  Advised to avoid NSAIDs due to chronic GI bleeding problems      Relevant Medications   DULoxetine  (CYMBALTA ) 30 MG capsule   Other Relevant Orders   ANA,IFA RA Diag Pnl w/rflx Tit/Patn   Uric acid   Sedimentation rate   Comprehensive metabolic panel with GFR   CBC with Differential/Platelet   TSH   Vitamin B12   VITAMIN D  25 Hydroxy (Vit-D Deficiency, Fractures)   Patient Instructions  Dolor en las articulaciones Joint Pain  Varias pueden ser las causas del dolor en las articulaciones. El dolor puede desaparecer si usted sigue las indicaciones del mdico para Public house manager. A veces, es posible que necesite ms tratamiento. Las causas del dolor en las articulaciones pueden ser las siguientes: Moretones en la zona de la articulacin. Una lesin causada por movimientos repetidos. Desgaste de la articulacin a medida que envejece. Acumulacin de  cristales de cido rico en la articulacin. Esto tambin se denomina "gota". Irritacin e hinchazn de la articulacin. Tipos de artritis. Infecciones de la articulacin o del hueso. El mdico puede indicarle que tome analgsicos o que use una venda elstica, un cabestrillo o una frula. Si el dolor en la articulacin contina, es posible que necesite realizarse anlisis de laboratorio o [employer]  de diagnstico por imgenes para Emergency planning/management officer causa del dolor en la articulacin. Siga estas indicaciones en su casa: Si tiene una venda elstica, un cabestrillo o una frula que se puede retirar: Use la venda, el cabestrillo o la frula como se lo haya indicado el mdico. Quteselos solo si el mdico lo autoriza. Controle todos los das la piel que est abajo y a su alrededor. Informe al mdico si observa problemas. Afljelos si siente hormigueo en los dedos de la mano o del pie, o si se le entumecen, se le enfran o se ponen de color azul. Mantngalos limpios y secos. Pregntele al mdico si debera quitrselos antes de baarse. Si la venda, el cabestrillo o la frula no son impermeables: No deje que se mojen. Cbralos para ducharse o baarse. Use una cubierta que no permita que [place]. Control del dolor, la rigidez y la hinchazn     Si se lo indican, aplique hielo sobre la zona. Si tiene [employer], un cabestrillo o una frula que se puede sacar, quteselos como se lo hayan indicado. Ponga el hielo en una bolsa plstica. Coloque una toalla entre la piel y la bolsa. Aplique el hielo durante 20 minutos, 2 o 3 veces por da. Si se lo indican, aplique calor sobre la zona. Hgalo con la frecuencia que le hayan indicado. Use la fuente de calor que el mdico le recomiende, como una compresa de calor hmedo o una almohadilla trmica. Coloque una toalla entre la piel y la fuente de calor. Aplique calor durante 20 a 30 minutos. Si la piel se le pone de color rojo brillante, quite el hielo o  el calor de inmediato para evitar daos en la piel. El [place] de dao es mayor si no puede sentir dolor, Airline pilot o fro. Mueva los dedos de las manos o de los pies con frecuencia para reducir la rigidez y la hinchazn. Cuando est sentado o acostado, mantenga la zona lesionada por encima del nivel del corazn. Use una almohada para sostener la zona con dolor, segn sea necesario. Actividad Ponga la articulacin con dolor en reposo como se lo hayan indicado. No haga cosas que le causen dolor o que lo intensifiquen. Comience a ejercitar o a estirar la zona afectada como se lo haya indicado el mdico. Retome a las actividades habituales cuando se lo indiquen. Pregunte qu cosas son seguras para que haga. Indicaciones generales Use los medicamentos como se lo haya indicado el mdico. El tratamiento puede incluir medicamentos para Chief Technology Officer y la hinchazn que se toman por la boca o que se aplican sobre la piel. No fume ni use vapeadores o productos que tengan nicotina o tabaco. Si necesita ayuda para dejar de consumir estos productos, hable con su mdico. Concurra a todas las visitas de seguimiento. El mdico querr controlar su afeccin. Comunquese con un mdico si: El dolor no se alivia con medicamentos. El dolor en la articulacin no mejora en el trmino de 3 das. Tiene ms hinchazn o moretones. Tiene fiebre. Pierde 10 libras (4.5 kg) o ms, sin proponrselo. Solicite ayuda de inmediato si: No puede mover la articulacin. Si los dedos de la mano o del pie se le entumecen, si siente hormigueo o si se le enfran y se tornan de Research officer, trade union. Tiene fiebre y burkina faso articulacin que est roja e inflamada, y se siente caliente al tacto. Esta informacin no tiene Theme park manager el consejo del mdico. Asegrese de hacerle al mdico cualquier pregunta que tenga. Document Revised: 10/28/2022  Document Reviewed: 10/28/2022 Elsevier Patient Education  2024 Elsevier Inc.     Emil Schaumann, MD [employer]  [facility] at [facility]

## 2024-02-24 LAB — ANA,IFA RA DIAG PNL W/RFLX TIT/PATN
Anti Nuclear Antibody (ANA): NEGATIVE
Cyclic Citrullin Peptide Ab: <16 U
Rheumatoid fact SerPl-aCnc: <10 [IU]/mL (ref <14)

## 2024-03-21 ENCOUNTER — Other Ambulatory Visit: Payer: Self-pay

## 2024-03-21 DIAGNOSIS — D509 Iron deficiency anemia, unspecified: Secondary | ICD-10-CM

## 2024-03-22 ENCOUNTER — Inpatient Hospital Stay: Attending: Nurse Practitioner

## 2024-03-22 DIAGNOSIS — D509 Iron deficiency anemia, unspecified: Secondary | ICD-10-CM | POA: Insufficient documentation

## 2024-03-22 LAB — CBC WITH DIFFERENTIAL (CANCER CENTER ONLY)
Abs Immature Granulocytes: 0.01 K/uL (ref 0.00–0.07)
Basophils Absolute: 0 K/uL (ref 0.0–0.1)
Basophils Relative: 1 %
Eosinophils Absolute: 0.2 K/uL (ref 0.0–0.5)
Eosinophils Relative: 3 %
HCT: 44.4 % (ref 39.0–52.0)
Hemoglobin: 15.3 g/dL (ref 13.0–17.0)
Immature Granulocytes: 0 %
Lymphocytes Relative: 25 %
Lymphs Abs: 1.5 K/uL (ref 0.7–4.0)
MCH: 28.4 pg (ref 26.0–34.0)
MCHC: 34.5 g/dL (ref 30.0–36.0)
MCV: 82.5 fL (ref 80.0–100.0)
Monocytes Absolute: 0.6 K/uL (ref 0.1–1.0)
Monocytes Relative: 10 %
Neutro Abs: 3.8 K/uL (ref 1.7–7.7)
Neutrophils Relative %: 61 %
Platelet Count: 220 K/uL (ref 150–400)
RBC: 5.38 MIL/uL (ref 4.22–5.81)
RDW: 14.6 % (ref 11.5–15.5)
WBC Count: 6.1 K/uL (ref 4.0–10.5)
nRBC: 0 % (ref 0.0–0.2)

## 2024-03-22 LAB — IRON AND IRON BINDING CAPACITY (CC-WL,HP ONLY)
Iron: 77 ug/dL (ref 45–182)
Saturation Ratios: 21 % (ref 17.9–39.5)
TIBC: 372 ug/dL (ref 250–450)
UIBC: 295 ug/dL (ref 117–376)

## 2024-03-22 LAB — FERRITIN: Ferritin: 78 ng/mL (ref 24–336)

## 2024-05-04 ENCOUNTER — Ambulatory Visit: Payer: Self-pay

## 2024-05-04 NOTE — Telephone Encounter (Signed)
 FYI Only or Action Required?: FYI only for provider: appointment scheduled on 05/10/24.  Patient was last seen in primary care on 02/22/2024 by Purcell Emil Schanz, MD.  Called Nurse Triage reporting Joint Pain.  Symptoms began several months ago.  Interventions attempted: OTC medications: tylenol .  Symptoms are: unchanged.  Triage Disposition: See PCP Within 2 Weeks  Patient/caregiver understands and will follow disposition?: Yes   Copied from CRM [id number]. Topic: Clinical - Red Word Triage >> May 04, 2024  3:51 PM Mia F wrote: Red Word that prompted transfer to Nurse Triage: Joint pain all over his body that is getting worse. No swelling. No other symptoms. Going on for a few months but is getting worse. He says he takes Tylenol  twice a day and it helps but he is having to take it every day Reason for Disposition  [1] MILD pain (e.g., does not interfere with normal activities) AND [2] present > 7 days  Answer Assessment - Initial Assessment Questions No available appts today. Offered appt with alternative provider, pt declined and requests Dr.Sagardia and appt for next Wednesday; scheduled.  Advised call back/ UC if symptoms worsen.  1. ONSET: When did the muscle aches or body pains start?      6 months ago, already seen MD for symptoms but given anxiety meds, since labs was normal. Pain has increased since last seen MD.  2. LOCATION: What part of your body is hurting? (e.g., entire body, arms, legs)      Generalized pain; takes Tylenol   3. SEVERITY: How bad is the pain? (Scale 1-10; or mild, moderate, severe)     3/10 but when working or no tylenol  8/10 5. FEVER: Do you have a fever? If Yes, ask: What is your temperature, how was it measured, and  when did it start?      Denies fever, swollen joints or redness 6. OTHER SYMPTOMS: Do you have any other symptoms? (e.g., chest pain, cold or flu symptoms, rash, weakness, weight loss)     Denies chest pain, rash, cold  symptoms  Protocols used: Muscle Aches and Body Pain-A-AH

## 2024-05-10 ENCOUNTER — Ambulatory Visit: Admitting: Emergency Medicine

## 2024-05-10 ENCOUNTER — Encounter: Payer: Self-pay | Admitting: Emergency Medicine

## 2024-05-10 VITALS — BP 116/68 | HR 81 | Temp 97.8°F | Ht 65.0 in | Wt 174.0 lb

## 2024-05-10 DIAGNOSIS — M790 Rheumatism, unspecified: Secondary | ICD-10-CM | POA: Diagnosis not present

## 2024-05-10 DIAGNOSIS — M2550 Pain in unspecified joint: Secondary | ICD-10-CM | POA: Diagnosis not present

## 2024-05-10 DIAGNOSIS — G894 Chronic pain syndrome: Secondary | ICD-10-CM | POA: Diagnosis not present

## 2024-05-10 NOTE — Assessment & Plan Note (Signed)
 Presented with chronic pain to multiple joints Negative recent rheumatology blood work History of peptic ulcer disease and bleeding episodes History of chronic GI bleed Unable to take NSAIDs Tylenol  sometimes works Recommend evaluation by rheumatologist.  Referral placed today. Will also refer to pain management clinic

## 2024-05-10 NOTE — Progress Notes (Signed)
 Troy Stevenson 58 y.o.   Chief Complaint  Patient presents with   Pain    Legs, knee, arms, hands fingers all over the body pt states     HISTORY OF PRESENT ILLNESS: This is a 58 y.o. male complaining of chronic pain to multiple joints which started about 6 years ago but almost daily for the past year Takes Tylenol  with partial response History of peptic ulcer disease with bleeding ulcers in the past.  Unable to tolerate NSAIDs Recent rheumatology blood work within normal limits No other associated symptoms No other complaints or medical concerns today.  HPI   Prior to Admission medications   Medication Sig Start Date End Date Taking? Authorizing Provider  B Complex Vitamins (VITAMIN B COMPLEX) TABS Take 1 tablet by mouth daily.   Yes [provider]  DULoxetine  (CYMBALTA ) 30 MG capsule Take 1 capsule (30 mg total) by mouth daily. 02/22/24 05/10/24 Yes Janasia Coverdale, Emil Schanz, MD  omeprazole  (PRILOSEC) 40 MG capsule Take 40 mg by mouth daily. 02/25/18  Yes [provider]    No Known Allergies  Patient Active Problem List   Diagnosis Date Noted   Arthralgia of multiple joints 02/22/2024   Chronic blood loss anemia 12/07/2022   Diverticulosis 12/07/2022   Internal hemorrhoids 12/07/2022   Chronic GI bleeding 11/25/2022   Chronic iron  deficiency anemia 08/21/2022   Fundic gland polyposis of stomach 08/21/2022   Symptomatic anemia 08/19/2022   Chronic anxiety 02/24/2013    Past Medical History:  Diagnosis Date   Anemia Aug 19 2022   Anxiety    Blood transfusion without reported diagnosis Feb14 2024   Hepatitis     Past Surgical History:  Procedure Laterality Date   BIOPSY  08/21/2022   Procedure: BIOPSY;  Surgeon: Stacia Glendia BRAVO, MD;  Location: [facility] ENDOSCOPY;  Service: Gastroenterology;;   COLONOSCOPY WITH PROPOFOL  N/A 08/21/2022   Procedure: COLONOSCOPY WITH PROPOFOL ;  Surgeon: Stacia Glendia BRAVO, MD;  Location: [facility] ENDOSCOPY;  Service:  Gastroenterology;  Laterality: N/A;   ESOPHAGOGASTRODUODENOSCOPY N/A 11/27/2022   Procedure: ESOPHAGOGASTRODUODENOSCOPY (EGD);  Surgeon: Nandigam, Kavitha V, MD;  Location: [facility]OSCOPY;  Service: Gastroenterology;  Laterality: N/A;   ESOPHAGOGASTRODUODENOSCOPY (EGD) WITH PROPOFOL  N/A 08/21/2022   Procedure: ESOPHAGOGASTRODUODENOSCOPY (EGD) WITH PROPOFOL ;  Surgeon: Stacia Glendia BRAVO, MD;  Location: [facility] ENDOSCOPY;  Service: Gastroenterology;  Laterality: N/A;   ESOPHAGOGASTRODUODENOSCOPY (EGD) WITH PROPOFOL  N/A 12/14/2022   Procedure: ESOPHAGOGASTRODUODENOSCOPY (EGD) WITH PROPOFOL ;  Surgeon: Stacia Glendia BRAVO, MD;  Location: [facility] ENDOSCOPY;  Service: Gastroenterology;  Laterality: N/A;   GIVENS CAPSULE STUDY N/A 11/27/2022   Procedure: GIVENS CAPSULE STUDY;  Surgeon: Shila Gustav GAILS, MD;  Location: [facility] ENDOSCOPY;  Service: Gastroenterology;  Laterality: N/A;   GIVENS CAPSULE STUDY N/A 12/14/2022   Procedure: GIVENS CAPSULE STUDY;  Surgeon: Stacia Glendia BRAVO, MD;  Location: [facility] ENDOSCOPY;  Service: Gastroenterology;  Laterality: N/A;   MALONEY DILATION  11/27/2022   Procedure: MALONEY DILATION;  Surgeon: Shila Gustav GAILS, MD;  Location: [facility] ENDOSCOPY;  Service: Gastroenterology;;  55   POLYPECTOMY  08/21/2022   Procedure: POLYPECTOMY;  Surgeon: Stacia Glendia BRAVO, MD;  Location: [facility] ENDOSCOPY;  Service: Gastroenterology;;   POLYPECTOMY  12/14/2022   Procedure: POLYPECTOMY;  Surgeon: Stacia Glendia BRAVO, MD;  Location: [facility] ENDOSCOPY;  Service: Gastroenterology;;    Social History   Socioeconomic History   Marital status: Divorced    Spouse name: Not on file   Number of children: 1   Years of education: Not on file  Highest education level: 11th grade  Occupational History   Not on file  Tobacco Use   Smoking status: Never   Smokeless tobacco: Never  Substance and Sexual Activity   Alcohol use: Yes    Alcohol/week: 15.0 standard drinks of alcohol    Types: 15 Cans of beer per week     Comment: 2-3 beers daily   Drug use: Never   Sexual activity: Not Currently  Other Topics Concern   Not on file  Social History Narrative   Not on file   Social Drivers of Health   Financial Resource Strain: Low Risk  (12/07/2022)   Overall Financial Resource Strain (CARDIA)    Difficulty of Paying Living Expenses: Not very hard  Food Insecurity: No Food Insecurity (12/07/2022)   Hunger Vital Sign    Worried About Running Out of Food in the Last Year: Never true    Ran Out of Food in the Last Year: Never true  Transportation Needs: No Transportation Needs (12/07/2022)   PRAPARE - Administrator, Civil Service (Medical): No    Lack of Transportation (Non-Medical): No  Physical Activity: Inactive (12/07/2022)   Exercise Vital Sign    Days of Exercise per Week: 1 day    Minutes of Exercise per Session: 0 min  Stress: Stress Concern Present (12/07/2022)   [employer] of Occupational Health - Occupational Stress Questionnaire    Feeling of Stress : To some extent  Social Connections: Moderately Isolated (12/07/2022)   Social Connection and Isolation Panel    Frequency of Communication with Friends and Family: Three times a week    Frequency of Social Gatherings with Friends and Family: Once a week    Attends Religious Services: 1 to 4 times per year    Active Member of [employer] or Organizations: No    Attends Engineer, Structural: Not on file    Marital Status: Divorced  Intimate Partner Violence: Not At Risk (11/25/2022)   Humiliation, Afraid, Rape, and Kick questionnaire    Fear of Current or Ex-Partner: No    Emotionally Abused: No    Physically Abused: No    Sexually Abused: No    Family History  Problem Relation Age of Onset   Hypertension Father    Diabetes Father    Colon cancer Neg Hx    Rectal cancer Neg Hx      Review of Systems  Constitutional: Negative.  Negative for chills and fever.  HENT: Negative.  Negative for congestion and sore  throat.   Respiratory: Negative.  Negative for cough and shortness of breath.   Cardiovascular: Negative.  Negative for chest pain and palpitations.  Gastrointestinal:  Negative for abdominal pain, diarrhea, nausea and vomiting.  Genitourinary: Negative.  Negative for dysuria and hematuria.  Musculoskeletal:  Positive for joint pain.  Skin: Negative.  Negative for rash.  Neurological: Negative.  Negative for dizziness and headaches.  All other systems reviewed and are negative.   Vitals:   05/10/24 0916  BP: 116/68  Pulse: 81  Temp: 97.8 F (36.6 C)  SpO2: 97%    Physical Exam Vitals reviewed.  Constitutional:      Appearance: Normal appearance.  HENT:     Head: Normocephalic.     Mouth/Throat:     Mouth: Mucous membranes are moist.     Pharynx: Oropharynx is clear.  Eyes:     Extraocular Movements: Extraocular movements intact.     Pupils: Pupils are equal, round, and reactive  to light.  Cardiovascular:     Rate and Rhythm: Normal rate and regular rhythm.     Pulses: Normal pulses.     Heart sounds: Normal heart sounds.  Pulmonary:     Effort: Pulmonary effort is normal.     Breath sounds: Normal breath sounds.  Musculoskeletal:        General: No swelling, tenderness or deformity.     Cervical back: No tenderness.     Right lower leg: No edema.     Left lower leg: No edema.     Comments: No findings of synovitis  Lymphadenopathy:     Cervical: No cervical adenopathy.  Skin:    General: Skin is warm and dry.     Capillary Refill: Capillary refill takes less than 2 seconds.  Neurological:     General: No focal deficit present.     Mental Status: He is alert and oriented to person, place, and time.  Psychiatric:        Mood and Affect: Mood normal.        Behavior: Behavior normal.      ASSESSMENT & PLAN: A total of 42 minutes was spent with the patient and counseling/coordination of care regarding preparing for this visit, review of most recent office  visit notes, review of multiple chronic medical conditions and their management, differential diagnosis of arthralgias and need for rheumatology evaluation, pain management, review of all medications, review of most recent bloodwork results, review of health maintenance items, education on nutrition, prognosis, documentation, and need for follow up.   Problem List Items Addressed This Visit       Musculoskeletal and Integument   Rheumatism - Primary   Presented with chronic pain to multiple joints Negative recent rheumatology blood work History of peptic ulcer disease and bleeding episodes History of chronic GI bleed Unable to take NSAIDs Tylenol  sometimes works Recommend evaluation by rheumatologist.  Referral placed today. Will also refer to pain management clinic      Relevant Orders   Ambulatory referral to Rheumatology     Other   Arthralgia of multiple joints   Clinically stable.  No red flag signs or symptoms. Differential diagnosis discussed.  No synovitis or deformities on physical examination Recommend rheumatology evaluation.  Referral placed today Was able to start Cymbalta  30 mg daily.  Helping some. Pain management discussed.  Recommend Tylenol .  Advised to avoid NSAIDs due to chronic GI bleeding problems Recommend pain management clinic evaluation as well      Relevant Orders   Ambulatory referral to Rheumatology   Chronic pain syndrome   Pain management discussed Recommend pain management clinic evaluation Referral placed today.      Relevant Orders   Ambulatory referral to Pain Clinic   Patient Instructions  Dolor en las articulaciones Joint Pain  Varias pueden ser las causas del dolor en las articulaciones. El dolor puede desaparecer si usted sigue las indicaciones del mdico para public house manager. A veces, es posible que necesite ms tratamiento. Las causas del dolor en las articulaciones pueden ser las siguientes: Moretones en la zona de la  articulacin. Una lesin causada por movimientos repetidos. Desgaste de la articulacin a medida que envejece. Acumulacin de cristales de cido rico en la articulacin. Esto tambin se denomina "gota". Irritacin e hinchazn de la articulacin. Tipos de artritis. Infecciones de la articulacin o del hueso. El mdico puede indicarle que tome analgsicos o que use una venda elstica, un cabestrillo o una frula. Si el  dolor en la articulacin contina, es posible que necesite realizarse anlisis de laboratorio o estudios de diagnstico por imgenes para emergency planning/management officer causa del dolor en la articulacin. Siga estas indicaciones en su casa: Si tiene una venda elstica, un cabestrillo o una frula que se puede retirar: Use la venda, el cabestrillo o la frula como se lo haya indicado el mdico. Quteselos solo si el mdico lo autoriza. Controle todos los das la piel que est abajo y a su alrededor. Informe al mdico si observa problemas. Afljelos si siente hormigueo en los dedos de la mano o del pie, o si se le entumecen, se le enfran o se ponen de color azul. Mantngalos limpios y secos. Pregntele al mdico si debera quitrselos antes de baarse. Si la venda, el cabestrillo o la frula no son impermeables: No deje que se mojen. Cbralos para ducharse o baarse. Use una cubierta que no permita que [place]. Control del dolor, la rigidez y la hinchazn     Si se lo indican, aplique hielo sobre la zona. Si tiene honeywell, un cabestrillo o una frula que se puede sacar, quteselos como se lo hayan indicado. Ponga el hielo en una bolsa plstica. Coloque una toalla entre la piel y la bolsa. Aplique el hielo durante 20 minutos, 2 o 3 veces por da. Si se lo indican, aplique calor sobre la zona. Hgalo con la frecuencia que le hayan indicado. Use la fuente de calor que el mdico le recomiende, como una compresa de calor hmedo o una almohadilla trmica. Coloque una toalla entre la  piel y la fuente de calor. Aplique calor durante 20 a 30 minutos. Si la piel se le pone de color rojo brillante, quite el hielo o el calor de inmediato para evitar daos en la piel. [place] de dao es mayor si no puede sentir dolor, airline pilot o fro. Mueva los dedos de las manos o de los pies con frecuencia para reducir la rigidez y la hinchazn. Cuando est sentado o acostado, mantenga la zona lesionada por encima del nivel del corazn. Use una almohada para sostener la zona con dolor, segn sea necesario. Actividad Ponga la articulacin con dolor en reposo como se lo hayan indicado. No haga cosas que le causen dolor o que lo intensifiquen. Comience a ejercitar o a estirar la zona afectada como se lo haya indicado el mdico. Retome a las actividades habituales cuando se lo indiquen. Pregunte qu cosas son seguras para que haga. Indicaciones generales Use los medicamentos como se lo haya indicado el mdico. El tratamiento puede incluir medicamentos para chief technology officer y la hinchazn que se toman por la boca o que se aplican sobre la piel. No fume ni use vapeadores o productos que tengan nicotina o tabaco. Si necesita ayuda para dejar de consumir estos productos, hable con su mdico. Concurra a todas las visitas de seguimiento. El mdico querr controlar su afeccin. Comunquese con un mdico si: El dolor no se alivia con medicamentos. El dolor en la articulacin no mejora en el trmino de 3 das. Tiene ms hinchazn o moretones. Tiene fiebre. Pierde 10 libras (4.5 kg) o ms, sin proponrselo. Solicite ayuda de inmediato si: No puede mover la articulacin. Si los dedos de la mano o del pie se le entumecen, si siente hormigueo o si se le enfran y se tornan de research officer, trade union. Tiene fiebre y una articulacin que est roja e inflamada, y se siente caliente al tacto. Esta informacin no tiene building services engineer  consejo del mdico. Asegrese de hacerle al mdico cualquier pregunta que tenga. Document  Revised: 10/28/2022 Document Reviewed: 10/28/2022 Elsevier Patient Education  2024 Elsevier Inc.    Emil Schaumann, MD [facility]

## 2024-05-10 NOTE — Patient Instructions (Signed)
 Dolor en las articulaciones Joint Pain  Varias pueden ser las causas del dolor en las articulaciones. El dolor puede desaparecer si usted sigue las indicaciones del mdico para Public house manager. A veces, es posible que necesite ms tratamiento. Las causas del dolor en las articulaciones pueden ser las siguientes: Moretones en la zona de la articulacin. Una lesin causada por movimientos repetidos. Desgaste de la articulacin a medida que envejece. Acumulacin de cristales de cido rico en la articulacin. Esto tambin se denomina "gota". Irritacin e hinchazn de la articulacin. Tipos de artritis. Infecciones de la articulacin o del hueso. El mdico puede indicarle que tome analgsicos o que use una venda elstica, un cabestrillo o una frula. Si el dolor en la articulacin contina, es posible que necesite realizarse anlisis de laboratorio o estudios de diagnstico por imgenes para Emergency planning/management officer causa del dolor en la articulacin. Siga estas indicaciones en su casa: Si tiene una venda elstica, un cabestrillo o una frula que se puede retirar: Use la venda, el cabestrillo o la frula como se lo haya indicado el mdico. Quteselos solo si el mdico lo autoriza. Controle todos los das la piel que est abajo y a su alrededor. Informe al mdico si observa problemas. Afljelos si siente hormigueo en los dedos de la mano o del pie, o si se le entumecen, se le enfran o se ponen de color azul. Mantngalos limpios y secos. Pregntele al mdico si debera quitrselos antes de baarse. Si la venda, el cabestrillo o la frula no son impermeables: No deje que se mojen. Cbralos para ducharse o baarse. Use una cubierta que no permita que [place]. Control del dolor, la rigidez y la hinchazn     Si se lo indican, aplique hielo sobre la zona. Si tiene [employer], un cabestrillo o una frula que se puede sacar, quteselos como se lo hayan indicado. Ponga el hielo en una bolsa  plstica. Coloque una toalla entre la piel y la bolsa. Aplique el hielo durante 20 minutos, 2 o 3 veces por da. Si se lo indican, aplique calor sobre la zona. Hgalo con la frecuencia que le hayan indicado. Use la fuente de calor que el mdico le recomiende, como una compresa de calor hmedo o una almohadilla trmica. Coloque una toalla entre la piel y la fuente de calor. Aplique calor durante 20 a 30 minutos. Si la piel se le pone de color rojo brillante, quite el hielo o el calor de inmediato para evitar daos en la piel. El Struble de dao es mayor si no puede sentir dolor, Airline pilot o fro. Mueva los dedos de las manos o de los pies con frecuencia para reducir la rigidez y la hinchazn. Cuando est sentado o acostado, mantenga la zona lesionada por encima del nivel del corazn. Use una almohada para sostener la zona con dolor, segn sea necesario. Actividad Ponga la articulacin con dolor en reposo como se lo hayan indicado. No haga cosas que le causen dolor o que lo intensifiquen. Comience a ejercitar o a estirar la zona afectada como se lo haya indicado el mdico. Retome a las actividades habituales cuando se lo indiquen. Pregunte qu cosas son seguras para que haga. Indicaciones generales Use los medicamentos como se lo haya indicado el mdico. El tratamiento puede incluir medicamentos para Chief Technology Officer y la hinchazn que se toman por la boca o que se aplican sobre la piel. No fume ni use vapeadores o productos que tengan nicotina o tabaco. Si necesita ayuda para  dejar de consumir estos productos, hable con su mdico. Concurra a todas las visitas de seguimiento. El mdico querr controlar su afeccin. Comunquese con un mdico si: El dolor no se alivia con medicamentos. El dolor en la articulacin no mejora en el trmino de 3 das. Tiene ms hinchazn o moretones. Tiene fiebre. Pierde 10 libras (4.5 kg) o ms, sin proponrselo. Solicite ayuda de inmediato si: No puede mover la  articulacin. Si los dedos de la mano o del pie se le entumecen, si siente hormigueo o si se le enfran y se tornan de Research officer, trade union. Tiene fiebre y burkina faso articulacin que est roja e inflamada, y se siente caliente al tacto. Esta informacin no tiene Theme park manager el consejo del mdico. Asegrese de hacerle al mdico cualquier pregunta que tenga. Document Revised: 10/28/2022 Document Reviewed: 10/28/2022 Elsevier Patient Education  2024 [employer].

## 2024-05-10 NOTE — Assessment & Plan Note (Signed)
 Pain management discussed Recommend pain management clinic evaluation Referral placed today.

## 2024-05-10 NOTE — Assessment & Plan Note (Signed)
 Clinically stable.  No red flag signs or symptoms. Differential diagnosis discussed.  No synovitis or deformities on physical examination Recommend rheumatology evaluation.  Referral placed today Was able to start Cymbalta  30 mg daily.  Helping some. Pain management discussed.  Recommend Tylenol .  Advised to avoid NSAIDs due to chronic GI bleeding problems Recommend pain management clinic evaluation as well

## 2024-05-31 ENCOUNTER — Emergency Department (HOSPITAL_COMMUNITY): Admission: EM | Admit: 2024-05-31 | Discharge: 2024-05-31 | Disposition: A | Discharge status: Home / Self Care

## 2024-05-31 ENCOUNTER — Other Ambulatory Visit: Payer: Self-pay

## 2024-05-31 DIAGNOSIS — K921 Melena: Secondary | ICD-10-CM | POA: Insufficient documentation

## 2024-05-31 DIAGNOSIS — K625 Hemorrhage of anus and rectum: Secondary | ICD-10-CM | POA: Diagnosis not present

## 2024-05-31 LAB — COMPREHENSIVE METABOLIC PANEL WITH GFR
ALT: 26 U/L (ref 0–44)
AST: 24 U/L (ref 15–41)
Albumin: 4.4 g/dL (ref 3.5–5.0)
Alkaline Phosphatase: 135 U/L — ABNORMAL HIGH (ref 38–126)
Anion gap: 12 (ref 5–15)
BUN: 15 mg/dL (ref 6–20)
CO2: 25 mmol/L (ref 22–32)
Calcium: 9.2 mg/dL (ref 8.9–10.3)
Chloride: 101 mmol/L (ref 98–111)
Creatinine, Ser: 0.83 mg/dL (ref 0.61–1.24)
GFR, Estimated: >60 mL/min (ref >60)
Glucose, Bld: 105 mg/dL — ABNORMAL HIGH (ref 70–99)
Potassium: 4.1 mmol/L (ref 3.5–5.1)
Sodium: 137 mmol/L (ref 135–145)
Total Bilirubin: 0.7 mg/dL (ref 0.0–1.2)
Total Protein: 7.6 g/dL (ref 6.5–8.1)

## 2024-05-31 LAB — TYPE AND SCREEN
ABO/RH(D): O POS
Antibody Screen: NEGATIVE

## 2024-05-31 LAB — CBC
HCT: 42.3 % (ref 39.0–52.0)
Hemoglobin: 14.3 g/dL (ref 13.0–17.0)
MCH: 29.6 pg (ref 26.0–34.0)
MCHC: 33.8 g/dL (ref 30.0–36.0)
MCV: 87.6 fL (ref 80.0–100.0)
Platelets: 252 K/uL (ref 150–400)
RBC: 4.83 MIL/uL (ref 4.22–5.81)
RDW: 12.5 % (ref 11.5–15.5)
WBC: 6.5 K/uL (ref 4.0–10.5)
nRBC: 0 % (ref 0.0–0.2)

## 2024-05-31 LAB — POC OCCULT BLOOD, ED: Fecal Occult Bld: POSITIVE — AB

## 2024-05-31 MED ORDER — SUCRALFATE 1 G PO TABS: 1.0000 g | ORAL_TABLET | Freq: Three times a day (TID) | ORAL | 0 refills | Status: AC

## 2024-05-31 MED ORDER — LANSOPRAZOLE 30 MG PO CPDR: 30.0000 mg | DELAYED_RELEASE_CAPSULE | Freq: Every day | ORAL | 0 refills | Status: AC

## 2024-05-31 NOTE — ED Provider Notes (Signed)
 [place] EMERGENCY DEPARTMENT AT [facility] Provider Note   CSN: [id number] Arrival date & time: 05/31/24  9155     Patient presents with: Blood In Stools   Troy Stevenson is a 58 y.o. male.   Patient with history of anemia, hepatitis presents today with complaints of blood in his stools. Reports that he has noticed some blood in his stool for the past 3 weeks.  Denies fevers or chills, no nausea, vomiting, or diarrhea.  No abdominal pain.  Reports that he has seen bright red in the toilet and when he wipes. States it is not every time he has a bowel movement. He is not on anticoagulation. Does have history of GI bleed a year ago for which he required a blood transfusion and admission. He is concerned for same. Since his previous GI bleed he has stopped drinking alcohol and has made significant dietary changes as well, reports he has been doing well over the past year.    The history is provided by the patient. No language interpreter was used.       Prior to Admission medications   Medication Sig Start Date End Date Taking? Authorizing Provider  B Complex Vitamins (VITAMIN B COMPLEX) TABS Take 1 tablet by mouth daily.    [provider]  DULoxetine  (CYMBALTA ) 30 MG capsule Take 1 capsule (30 mg total) by mouth daily. 02/22/24 05/10/24  [place] Emil Schanz, MD  omeprazole  (PRILOSEC) 40 MG capsule Take 40 mg by mouth daily. 02/25/18   [provider]    Allergies: Patient has no known allergies.    Review of Systems  Gastrointestinal:  Positive for blood in stool.  All other systems reviewed and are negative.   Updated Vital Signs BP 129/70 (BP Location: Left Arm)   Pulse 90   Temp 99.1 F (37.3 C) (Oral)   Resp 16   SpO2 99%   Physical Exam Vitals and nursing note reviewed. Exam conducted with a chaperone present.  Constitutional:      General: He is not in acute distress.    Appearance: Normal appearance. He is normal weight. He is  not ill-appearing, toxic-appearing or diaphoretic.  HENT:     Head: Normocephalic and atraumatic.  Cardiovascular:     Rate and Rhythm: Normal rate.  Pulmonary:     Effort: Pulmonary effort is normal. No respiratory distress.  Abdominal:     General: Abdomen is flat.     Palpations: Abdomen is soft.     Tenderness: There is no abdominal tenderness.  Genitourinary:    Comments: Bright red blood present in the rectal vault along with light brown stool. No fissure or hemorrhoids visualized Musculoskeletal:        General: Normal range of motion.     Cervical back: Normal range of motion.  Skin:    General: Skin is warm and dry.  Neurological:     General: No focal deficit present.     Mental Status: He is alert.  Psychiatric:        Mood and Affect: Mood normal.        Behavior: Behavior normal.     (all labs ordered are listed, but only abnormal results are displayed) Labs Reviewed  COMPREHENSIVE METABOLIC PANEL WITH GFR - Abnormal; Notable for the following components:      Result Value   Glucose, Bld 105 (*)    Alkaline Phosphatase 135 (*)    All other components within normal limits  POC  OCCULT BLOOD, ED - Abnormal; Notable for the following components:   Fecal Occult Bld POSITIVE (*)    All other components within normal limits  CBC  TYPE AND SCREEN    EKG: None  Radiology: No results found.   Procedures   Medications Ordered in the ED - No data to display                                  Medical Decision Making Amount and/or Complexity of Data Reviewed Labs: ordered.   This patient is a 58 y.o. male who presents to the ED for concern of rectal bleeding, this involves an extensive number of treatment options, and is a complaint that carries with it a high risk of complications and morbidity. The emergent differential diagnosis prior to evaluation includes, but is not limited to,  The differential diagnosis for lower GI bleed includes but is not limited to  high flow upper GI bleed, diverticulosis, vascular ectasia/AVM, inflammatory bowel disease, infectious colitis, mesenteric ischemia or ischemic colitis,  colorectal cancer or polyps, internal hemorrhoids, aortoenteric fistula, rectal foreign body, rectal ulceration or anal fissure.  This is not an exhaustive differential.   Past Medical History / Co-morbidities / Social History:  has a past medical history of Anemia (Aug 19 2022), Anxiety, Blood transfusion without reported diagnosis ([id number]), and Hepatitis.  Additional history: Chart reviewed. Pertinent results include: seen and admitted multiple times in 2024 for GI bleeds, required transfusion  Physical Exam: Physical exam performed. The pertinent findings include: well appearing, abdomen soft and nontender, rectal exam showed bright red blood present in the rectal vault along with light brown stool. No fissure or hemorrhoids visualized  Lab Tests: I ordered, and personally interpreted labs.  The pertinent results include: No leukocytosis, hemoglobin stable at 14.3.  Hemoccult positive   Imaging Studies: Considered CT abdomen, however patient has no abdominal pain, therefore no indication for imaging at this time.  Disposition: After consideration of the diagnostic results and the patients response to treatment, I feel that emergency department workup does not suggest an emergent condition requiring admission or immediate intervention beyond what has been performed at this time. The plan is: discharge with close outpatient follow-up and return precautions. Patients hemoglobin is stable and he is pain free. Recommend he see his GI outpatient as well as his pcp.  He reports he never saw his GI since he was discharged from the hospital and request a referral which has been placed.  He also requests Carafate  and lansoprazole  which he has had previously, this has been provided as well. Evaluation and diagnostic testing in the emergency  department does not suggest an emergent condition requiring admission or immediate intervention beyond what has been performed at this time.  Plan for discharge with close PCP follow-up.  Patient is understanding and amenable with plan, educated on red flag symptoms that would prompt immediate return.  Patient discharged in stable condition.   I discussed this case with my attending physician Dr. Neysa who cosigned this note including patient's presenting symptoms, physical exam, and planned diagnostics and interventions. Attending physician stated agreement with plan or made changes to plan which were implemented.    Final diagnoses:  Hematochezia    ED Discharge Orders          Ordered    lansoprazole  (PREVACID ) 30 MG capsule  Daily        05/31/24 1234  sucralfate  (CARAFATE ) 1 g tablet  3 times daily with meals        05/31/24 1234    Ambulatory referral to Gastroenterology        05/31/24 1235          An After Visit Summary was printed and given to the patient.      Nora Lauraine LABOR, PA-C 05/31/24 1237    Neysa Caron PARAS, DO 05/31/24 1459

## 2024-05-31 NOTE — Discharge Instructions (Signed)
 As we discussed, your blood counts were normal today which is reassuring. I recommend that you follow-up with your GI specialist for continued evaluation and management of the blood in your stool.  I have given you a referral and you should hear from them in the next few days.  If you do not, please call the number provided.  In the interim, I have given you a prescription for 2 medicines you can take as prescribed every day.  Call your PCP to schedule a close follow-up appointment as well.  Return if development of any new or worsening symptoms.

## 2024-05-31 NOTE — ED Triage Notes (Signed)
 PT ambulatory to triage with complaints of blood in stool that has been persistent for 3 weeks. PT states that he sees blood when having bowel movements. PT denies bleeding in the absence of bowel movements. Pt is worried because he required a blood transfusion in the past.

## 2024-06-19 ENCOUNTER — Other Ambulatory Visit: Payer: Self-pay | Admitting: Nurse Practitioner

## 2024-06-19 DIAGNOSIS — D509 Iron deficiency anemia, unspecified: Secondary | ICD-10-CM

## 2024-06-19 NOTE — Progress Notes (Unsigned)
 [facility]   [phone] [phone]    Patient Care Team: Purcell Emil Schanz, MD as PCP - General (Internal Medicine)   CHIEF COMPLAINT: Follow up IDA  CURRENT THERAPY: Oral iron  with vit C source, IV iron  PRN  INTERVAL HISTORY Mr. Troy Stevenson returns for follow up, last seen by me 12/21/23, s/p IV venofer  200 mg x5 in 01/2024. Seen recently in ED for rectal bleeding, CBC was normal. FOBT+   ROS   Past Medical History:  Diagnosis Date   Anemia Aug 19 2022   Anxiety    Blood transfusion without reported diagnosis Feb14 2024   Hepatitis      Past Surgical History:  Procedure Laterality Date   BIOPSY  08/21/2022   Procedure: BIOPSY;  Surgeon: Stacia Glendia BRAVO, MD;  Location: [facility] ENDOSCOPY;  Service: Gastroenterology;;   COLONOSCOPY WITH PROPOFOL  N/A 08/21/2022   Procedure: COLONOSCOPY WITH PROPOFOL ;  Surgeon: Stacia Glendia BRAVO, MD;  Location: [facility] ENDOSCOPY;  Service: Gastroenterology;  Laterality: N/A;   ESOPHAGOGASTRODUODENOSCOPY N/A 11/27/2022   Procedure: ESOPHAGOGASTRODUODENOSCOPY (EGD);  Surgeon: Nandigam, Kavitha V, MD;  Location: [facility] ENDOSCOPY;  Service: Gastroenterology;  Laterality: N/A;   ESOPHAGOGASTRODUODENOSCOPY (EGD) WITH PROPOFOL  N/A 08/21/2022   Procedure: ESOPHAGOGASTRODUODENOSCOPY (EGD) WITH PROPOFOL ;  Surgeon: Stacia Glendia BRAVO, MD;  Location: [facility] ENDOSCOPY;  Service: Gastroenterology;  Laterality: N/A;   ESOPHAGOGASTRODUODENOSCOPY (EGD) WITH PROPOFOL  N/A 12/14/2022   Procedure: ESOPHAGOGASTRODUODENOSCOPY (EGD) WITH PROPOFOL ;  Surgeon: Stacia Glendia BRAVO, MD;  Location: [facility] ENDOSCOPY;  Service: Gastroenterology;  Laterality: N/A;   GIVENS CAPSULE STUDY N/A 11/27/2022   Procedure: GIVENS CAPSULE STUDY;  Surgeon: Shila Gustav GAILS, MD;  Location: [facility] ENDOSCOPY;  Service: Gastroenterology;  Laterality: N/A;   GIVENS CAPSULE STUDY N/A 12/14/2022   Procedure: GIVENS CAPSULE STUDY;  Surgeon: Stacia Glendia BRAVO, MD;   Location: [facility] ENDOSCOPY;  Service: Gastroenterology;  Laterality: N/A;   MALONEY DILATION  11/27/2022   Procedure: AGAPITO DILATION;  Surgeon: Shila Gustav GAILS, MD;  Location: [facility] ENDOSCOPY;  Service: Gastroenterology;;  54   POLYPECTOMY  08/21/2022   Procedure: POLYPECTOMY;  Surgeon: Stacia Glendia BRAVO, MD;  Location: [facility] ENDOSCOPY;  Service: Gastroenterology;;   POLYPECTOMY  12/14/2022   Procedure: POLYPECTOMY;  Surgeon: Stacia Glendia BRAVO, MD;  Location: [facility] ENDOSCOPY;  Service: Gastroenterology;;     Outpatient Encounter Medications as of 06/20/2024  Medication Sig   B Complex Vitamins (VITAMIN B COMPLEX) TABS Take 1 tablet by mouth daily.   DULoxetine  (CYMBALTA ) 30 MG capsule Take 1 capsule (30 mg total) by mouth daily.   lansoprazole  (PREVACID ) 30 MG capsule Take 1 capsule (30 mg total) by mouth daily at 12 noon.   omeprazole  (PRILOSEC) 40 MG capsule Take 40 mg by mouth daily.   sucralfate  (CARAFATE ) 1 g tablet Take 1 tablet (1 g total) by mouth with breakfast, with lunch, and with evening meal.   No facility-administered encounter medications on file as of 06/20/2024.     There were no vitals filed for this visit. There is no height or weight on file to calculate BMI.   ECOG PERFORMANCE STATUS: {CHL ONC ECOG PS:[id number]}  PHYSICAL EXAM GENERAL:alert, no distress and comfortable SKIN: no rash  EYES: sclera clear NECK: without mass LYMPH:  no palpable cervical or supraclavicular lymphadenopathy  LUNGS: clear with normal breathing effort HEART: regular rate & rhythm, no lower extremity edema ABDOMEN: abdomen soft, non-tender and normal bowel sounds NEURO: alert & oriented x 3 with fluent speech, no focal motor/sensory deficits  Breast exam:  PAC without erythema    CBC    Latest Ref Rng & Units 05/31/2024    9:11 AM 03/22/2024    7:49 AM 02/22/2024   11:12 AM  CBC  WBC 4.0 - 10.5 K/uL 6.5  6.1  5.2   Hemoglobin 13.0 - 17.0 g/dL 85.6  84.6  84.1   Hematocrit 39.0 -  52.0 % 42.3  44.4  47.9   Platelets 150 - 400 K/uL 252  220  220.0       CMP     Latest Ref Rng & Units 05/31/2024    9:11 AM 02/22/2024   11:12 AM 12/01/2023    9:02 AM  CMP  Glucose 70 - 99 mg/dL 894  893  98   BUN 6 - 20 mg/dL 15  13  20    Creatinine 0.61 - 1.24 mg/dL 9.16  9.05  9.22   Sodium 135 - 145 mmol/L 137  140  136   Potassium 3.5 - 5.1 mmol/L 4.1  3.9  4.1   Chloride 98 - 111 mmol/L 101  104  102   CO2 22 - 32 mmol/L 25  26  27    Calcium 8.9 - 10.3 mg/dL 9.2  9.2  9.5   Total Protein 6.5 - 8.1 g/dL 7.6  7.5  7.9   Total Bilirubin 0.0 - 1.2 mg/dL 0.7  0.6  0.6   Alkaline Phos 38 - 126 U/L 135  104  103   AST 15 - 41 U/L 24  20  28    ALT 0 - 44 U/L 26  27  30        ASSESSMENT & PLAN:  PLAN:  No orders of the defined types were placed in this encounter.     All questions were answered. The patient knows to call the clinic with any problems, questions or concerns. No barriers to learning were detected. I spent *** counseling the patient face to face. The total time spent in the appointment was *** and more than 50% was on counseling, review of test results, and coordination of care.   Aubry Rankin K Choya Tornow, NP 06/19/2024 9:14 PM

## 2024-06-20 ENCOUNTER — Other Ambulatory Visit: Payer: Self-pay

## 2024-06-20 ENCOUNTER — Inpatient Hospital Stay: Attending: Nurse Practitioner | Admitting: Nurse Practitioner

## 2024-06-20 ENCOUNTER — Inpatient Hospital Stay

## 2024-06-20 ENCOUNTER — Encounter: Payer: Self-pay | Admitting: Nurse Practitioner

## 2024-06-20 VITALS — BP 118/76 | HR 83 | Temp 97.6°F | Resp 17 | Wt 180.6 lb

## 2024-06-20 DIAGNOSIS — D509 Iron deficiency anemia, unspecified: Secondary | ICD-10-CM | POA: Diagnosis not present

## 2024-06-20 LAB — CBC WITH DIFFERENTIAL (CANCER CENTER ONLY)
Abs Immature Granulocytes: 0.04 K/uL (ref 0.00–0.07)
Basophils Absolute: 0.1 K/uL (ref 0.0–0.1)
Basophils Relative: 1 %
Eosinophils Absolute: 0.1 K/uL (ref 0.0–0.5)
Eosinophils Relative: 2 %
HCT: 41.1 % (ref 39.0–52.0)
Hemoglobin: 13.6 g/dL (ref 13.0–17.0)
Immature Granulocytes: 1 %
Lymphocytes Relative: 26 %
Lymphs Abs: 1.9 K/uL (ref 0.7–4.0)
MCH: 27.3 pg (ref 26.0–34.0)
MCHC: 33.1 g/dL (ref 30.0–36.0)
MCV: 82.4 fL (ref 80.0–100.0)
Monocytes Absolute: 0.7 K/uL (ref 0.1–1.0)
Monocytes Relative: 9 %
Neutro Abs: 4.3 K/uL (ref 1.7–7.7)
Neutrophils Relative %: 61 %
Platelet Count: 291 K/uL (ref 150–400)
RBC: 4.99 MIL/uL (ref 4.22–5.81)
RDW: 12.9 % (ref 11.5–15.5)
WBC Count: 7.1 K/uL (ref 4.0–10.5)
nRBC: 0 % (ref 0.0–0.2)

## 2024-06-20 LAB — IRON AND IRON BINDING CAPACITY (CC-WL,HP ONLY)
Iron: 29 ug/dL — ABNORMAL LOW (ref 45–182)
Saturation Ratios: 6 % — ABNORMAL LOW (ref 17.9–39.5)
TIBC: 469 ug/dL — ABNORMAL HIGH (ref 250–450)
UIBC: 440 ug/dL

## 2024-06-20 LAB — FERRITIN: Ferritin: 12 ng/mL — ABNORMAL LOW (ref 24–336)

## 2024-08-22 ENCOUNTER — Ambulatory Visit: Admitting: Internal Medicine

## 2024-09-18 ENCOUNTER — Inpatient Hospital Stay

## 2024-12-19 ENCOUNTER — Inpatient Hospital Stay: Admitting: Nurse Practitioner

## 2024-12-19 ENCOUNTER — Inpatient Hospital Stay
